# Patient Record
Sex: Female | Born: 1945
Health system: Southern US, Community
[De-identification: ages and names within clinical notes are randomized; demographics above are authoritative.]

## PROBLEM LIST (undated history)

## (undated) HISTORY — PX: BREAST EXCISIONAL BIOPSY: SUR124

---

## 1998-08-29 ENCOUNTER — Encounter (INDEPENDENT_AMBULATORY_CARE_PROVIDER_SITE_OTHER): Payer: Self-pay

## 1998-08-29 ENCOUNTER — Other Ambulatory Visit: Admission: RE | Admit: 1998-08-29 | Discharge: 1998-08-29 | Payer: Self-pay | Admitting: Obstetrics and Gynecology

## 1998-12-07 ENCOUNTER — Encounter: Admission: RE | Admit: 1998-12-07 | Discharge: 1998-12-07 | Payer: Self-pay | Admitting: Obstetrics and Gynecology

## 1998-12-07 ENCOUNTER — Encounter: Payer: Self-pay | Admitting: Obstetrics and Gynecology

## 1999-12-10 ENCOUNTER — Encounter: Admission: RE | Admit: 1999-12-10 | Discharge: 1999-12-10 | Payer: Self-pay | Admitting: Obstetrics and Gynecology

## 1999-12-10 ENCOUNTER — Encounter: Payer: Self-pay | Admitting: Obstetrics and Gynecology

## 2000-12-10 ENCOUNTER — Encounter: Payer: Self-pay | Admitting: Internal Medicine

## 2000-12-10 ENCOUNTER — Encounter: Admission: RE | Admit: 2000-12-10 | Discharge: 2000-12-10 | Payer: Self-pay | Admitting: Internal Medicine

## 2001-12-10 ENCOUNTER — Encounter: Payer: Self-pay | Admitting: Internal Medicine

## 2001-12-10 ENCOUNTER — Encounter: Admission: RE | Admit: 2001-12-10 | Discharge: 2001-12-10 | Payer: Self-pay | Admitting: Internal Medicine

## 2002-12-15 ENCOUNTER — Encounter: Admission: RE | Admit: 2002-12-15 | Discharge: 2002-12-15 | Payer: Self-pay | Admitting: Internal Medicine

## 2003-12-15 ENCOUNTER — Encounter: Admission: RE | Admit: 2003-12-15 | Discharge: 2003-12-15 | Payer: Self-pay | Admitting: Internal Medicine

## 2004-12-17 ENCOUNTER — Encounter: Admission: RE | Admit: 2004-12-17 | Discharge: 2004-12-17 | Payer: Self-pay | Admitting: Internal Medicine

## 2005-12-19 ENCOUNTER — Encounter: Admission: RE | Admit: 2005-12-19 | Discharge: 2005-12-19 | Payer: Self-pay | Admitting: Internal Medicine

## 2006-12-22 ENCOUNTER — Encounter: Admission: RE | Admit: 2006-12-22 | Discharge: 2006-12-22 | Payer: Self-pay | Admitting: Internal Medicine

## 2007-09-28 ENCOUNTER — Other Ambulatory Visit: Admission: RE | Admit: 2007-09-28 | Discharge: 2007-09-28 | Payer: Self-pay | Admitting: Obstetrics and Gynecology

## 2007-12-24 ENCOUNTER — Encounter: Admission: RE | Admit: 2007-12-24 | Discharge: 2007-12-24 | Payer: Self-pay | Admitting: Internal Medicine

## 2008-09-28 ENCOUNTER — Other Ambulatory Visit: Admission: RE | Admit: 2008-09-28 | Discharge: 2008-09-28 | Payer: Self-pay | Admitting: Obstetrics and Gynecology

## 2008-12-26 ENCOUNTER — Encounter: Admission: RE | Admit: 2008-12-26 | Discharge: 2008-12-26 | Payer: Self-pay | Admitting: Internal Medicine

## 2009-10-02 ENCOUNTER — Other Ambulatory Visit: Admission: RE | Admit: 2009-10-02 | Discharge: 2009-10-02 | Payer: Self-pay | Admitting: Obstetrics and Gynecology

## 2009-12-27 ENCOUNTER — Encounter: Admission: RE | Admit: 2009-12-27 | Discharge: 2009-12-27 | Payer: Self-pay | Admitting: Internal Medicine

## 2010-06-12 ENCOUNTER — Other Ambulatory Visit: Payer: Self-pay | Admitting: Dermatology

## 2010-10-04 ENCOUNTER — Other Ambulatory Visit: Payer: Self-pay | Admitting: Obstetrics and Gynecology

## 2010-10-04 ENCOUNTER — Other Ambulatory Visit (HOSPITAL_COMMUNITY)
Admission: RE | Admit: 2010-10-04 | Discharge: 2010-10-04 | Disposition: A | Discharge status: Home / Self Care | Payer: PRIVATE HEALTH INSURANCE | Source: Ambulatory Visit | Attending: Obstetrics and Gynecology | Admitting: Obstetrics and Gynecology

## 2010-10-04 DIAGNOSIS — Z01419 Encounter for gynecological examination (general) (routine) without abnormal findings: Secondary | ICD-10-CM | POA: Insufficient documentation

## 2010-11-06 ENCOUNTER — Other Ambulatory Visit: Payer: Self-pay | Admitting: Internal Medicine

## 2010-11-06 DIAGNOSIS — Z1231 Encounter for screening mammogram for malignant neoplasm of breast: Secondary | ICD-10-CM

## 2010-12-28 ENCOUNTER — Ambulatory Visit
Admission: RE | Admit: 2010-12-28 | Discharge: 2010-12-28 | Disposition: A | Discharge status: Home / Self Care | Payer: PRIVATE HEALTH INSURANCE | Source: Ambulatory Visit | Attending: Internal Medicine | Admitting: Internal Medicine

## 2010-12-28 DIAGNOSIS — Z1231 Encounter for screening mammogram for malignant neoplasm of breast: Secondary | ICD-10-CM

## 2011-06-05 ENCOUNTER — Other Ambulatory Visit: Payer: Self-pay | Admitting: Dermatology

## 2011-07-09 ENCOUNTER — Other Ambulatory Visit: Payer: Self-pay | Admitting: Obstetrics and Gynecology

## 2011-07-09 ENCOUNTER — Other Ambulatory Visit (HOSPITAL_COMMUNITY)
Admission: RE | Admit: 2011-07-09 | Discharge: 2011-07-09 | Disposition: A | Discharge status: Home / Self Care | Payer: PRIVATE HEALTH INSURANCE | Source: Ambulatory Visit | Attending: Obstetrics and Gynecology | Admitting: Obstetrics and Gynecology

## 2011-07-09 DIAGNOSIS — Z1159 Encounter for screening for other viral diseases: Secondary | ICD-10-CM | POA: Insufficient documentation

## 2011-07-09 DIAGNOSIS — Z01419 Encounter for gynecological examination (general) (routine) without abnormal findings: Secondary | ICD-10-CM | POA: Insufficient documentation

## 2011-11-01 ENCOUNTER — Other Ambulatory Visit: Payer: Self-pay | Admitting: Internal Medicine

## 2011-11-01 DIAGNOSIS — Z1231 Encounter for screening mammogram for malignant neoplasm of breast: Secondary | ICD-10-CM

## 2011-12-05 DIAGNOSIS — L821 Other seborrheic keratosis: Secondary | ICD-10-CM | POA: Diagnosis not present

## 2011-12-05 DIAGNOSIS — D239 Other benign neoplasm of skin, unspecified: Secondary | ICD-10-CM | POA: Diagnosis not present

## 2011-12-30 ENCOUNTER — Ambulatory Visit
Admission: RE | Admit: 2011-12-30 | Discharge: 2011-12-30 | Disposition: A | Discharge status: Home / Self Care | Payer: Medicare Other | Source: Ambulatory Visit | Attending: Internal Medicine | Admitting: Internal Medicine

## 2011-12-30 DIAGNOSIS — Z1231 Encounter for screening mammogram for malignant neoplasm of breast: Secondary | ICD-10-CM

## 2012-02-24 DIAGNOSIS — E559 Vitamin D deficiency, unspecified: Secondary | ICD-10-CM | POA: Diagnosis not present

## 2012-02-24 DIAGNOSIS — R7301 Impaired fasting glucose: Secondary | ICD-10-CM | POA: Diagnosis not present

## 2012-02-24 DIAGNOSIS — G35 Multiple sclerosis: Secondary | ICD-10-CM | POA: Diagnosis not present

## 2012-02-24 DIAGNOSIS — Z23 Encounter for immunization: Secondary | ICD-10-CM | POA: Diagnosis not present

## 2012-02-24 DIAGNOSIS — M899 Disorder of bone, unspecified: Secondary | ICD-10-CM | POA: Diagnosis not present

## 2012-02-24 DIAGNOSIS — Z0000 Encounter for general adult medical examination without abnormal findings: Secondary | ICD-10-CM | POA: Diagnosis not present

## 2012-02-24 DIAGNOSIS — M949 Disorder of cartilage, unspecified: Secondary | ICD-10-CM | POA: Diagnosis not present

## 2012-02-24 DIAGNOSIS — Z136 Encounter for screening for cardiovascular disorders: Secondary | ICD-10-CM | POA: Diagnosis not present

## 2012-03-05 DIAGNOSIS — Z79899 Other long term (current) drug therapy: Secondary | ICD-10-CM | POA: Diagnosis not present

## 2012-05-28 DIAGNOSIS — Z85828 Personal history of other malignant neoplasm of skin: Secondary | ICD-10-CM | POA: Diagnosis not present

## 2012-05-28 DIAGNOSIS — L820 Inflamed seborrheic keratosis: Secondary | ICD-10-CM | POA: Diagnosis not present

## 2012-05-28 DIAGNOSIS — D235 Other benign neoplasm of skin of trunk: Secondary | ICD-10-CM | POA: Diagnosis not present

## 2012-05-28 DIAGNOSIS — D2370 Other benign neoplasm of skin of unspecified lower limb, including hip: Secondary | ICD-10-CM | POA: Diagnosis not present

## 2012-05-28 DIAGNOSIS — D485 Neoplasm of uncertain behavior of skin: Secondary | ICD-10-CM | POA: Diagnosis not present

## 2012-05-28 DIAGNOSIS — L819 Disorder of pigmentation, unspecified: Secondary | ICD-10-CM | POA: Diagnosis not present

## 2012-05-28 DIAGNOSIS — L84 Corns and callosities: Secondary | ICD-10-CM | POA: Diagnosis not present

## 2012-05-28 DIAGNOSIS — C44711 Basal cell carcinoma of skin of unspecified lower limb, including hip: Secondary | ICD-10-CM | POA: Diagnosis not present

## 2012-05-28 DIAGNOSIS — L821 Other seborrheic keratosis: Secondary | ICD-10-CM | POA: Diagnosis not present

## 2012-07-10 ENCOUNTER — Other Ambulatory Visit (HOSPITAL_COMMUNITY)
Admission: RE | Admit: 2012-07-10 | Discharge: 2012-07-10 | Disposition: A | Discharge status: Home / Self Care | Payer: Medicare Other | Source: Ambulatory Visit | Attending: Obstetrics and Gynecology | Admitting: Obstetrics and Gynecology

## 2012-07-10 ENCOUNTER — Other Ambulatory Visit: Payer: Self-pay | Admitting: Obstetrics and Gynecology

## 2012-07-10 DIAGNOSIS — N9089 Other specified noninflammatory disorders of vulva and perineum: Secondary | ICD-10-CM | POA: Diagnosis not present

## 2012-07-10 DIAGNOSIS — Z124 Encounter for screening for malignant neoplasm of cervix: Secondary | ICD-10-CM | POA: Insufficient documentation

## 2012-07-10 DIAGNOSIS — Z01419 Encounter for gynecological examination (general) (routine) without abnormal findings: Secondary | ICD-10-CM | POA: Diagnosis not present

## 2012-10-23 ENCOUNTER — Other Ambulatory Visit: Payer: Self-pay

## 2012-10-23 DIAGNOSIS — Z1231 Encounter for screening mammogram for malignant neoplasm of breast: Secondary | ICD-10-CM

## 2012-11-26 DIAGNOSIS — D692 Other nonthrombocytopenic purpura: Secondary | ICD-10-CM | POA: Diagnosis not present

## 2012-11-26 DIAGNOSIS — Z85828 Personal history of other malignant neoplasm of skin: Secondary | ICD-10-CM | POA: Diagnosis not present

## 2012-11-26 DIAGNOSIS — L821 Other seborrheic keratosis: Secondary | ICD-10-CM | POA: Diagnosis not present

## 2012-11-26 DIAGNOSIS — D2370 Other benign neoplasm of skin of unspecified lower limb, including hip: Secondary | ICD-10-CM | POA: Diagnosis not present

## 2012-11-26 DIAGNOSIS — D239 Other benign neoplasm of skin, unspecified: Secondary | ICD-10-CM | POA: Diagnosis not present

## 2012-12-30 ENCOUNTER — Ambulatory Visit
Admission: RE | Admit: 2012-12-30 | Discharge: 2012-12-30 | Disposition: A | Discharge status: Home / Self Care | Payer: Medicare Other | Source: Ambulatory Visit

## 2012-12-30 DIAGNOSIS — Z1231 Encounter for screening mammogram for malignant neoplasm of breast: Secondary | ICD-10-CM

## 2013-03-02 DIAGNOSIS — R7301 Impaired fasting glucose: Secondary | ICD-10-CM | POA: Diagnosis not present

## 2013-03-02 DIAGNOSIS — Z131 Encounter for screening for diabetes mellitus: Secondary | ICD-10-CM | POA: Diagnosis not present

## 2013-03-02 DIAGNOSIS — Z23 Encounter for immunization: Secondary | ICD-10-CM | POA: Diagnosis not present

## 2013-03-02 DIAGNOSIS — Z0000 Encounter for general adult medical examination without abnormal findings: Secondary | ICD-10-CM | POA: Diagnosis not present

## 2013-03-02 DIAGNOSIS — G35 Multiple sclerosis: Secondary | ICD-10-CM | POA: Diagnosis not present

## 2013-03-02 DIAGNOSIS — M949 Disorder of cartilage, unspecified: Secondary | ICD-10-CM | POA: Diagnosis not present

## 2013-03-02 DIAGNOSIS — M899 Disorder of bone, unspecified: Secondary | ICD-10-CM | POA: Diagnosis not present

## 2013-03-02 DIAGNOSIS — Z86010 Personal history of colonic polyps: Secondary | ICD-10-CM | POA: Diagnosis not present

## 2013-03-02 DIAGNOSIS — E7800 Pure hypercholesterolemia, unspecified: Secondary | ICD-10-CM | POA: Diagnosis not present

## 2013-03-31 ENCOUNTER — Other Ambulatory Visit: Payer: Self-pay | Admitting: Gastroenterology

## 2013-03-31 DIAGNOSIS — K620 Anal polyp: Secondary | ICD-10-CM | POA: Diagnosis not present

## 2013-03-31 DIAGNOSIS — Z09 Encounter for follow-up examination after completed treatment for conditions other than malignant neoplasm: Secondary | ICD-10-CM | POA: Diagnosis not present

## 2013-03-31 DIAGNOSIS — D126 Benign neoplasm of colon, unspecified: Secondary | ICD-10-CM | POA: Diagnosis not present

## 2013-03-31 DIAGNOSIS — Z86010 Personal history of colonic polyps: Secondary | ICD-10-CM | POA: Diagnosis not present

## 2013-05-27 ENCOUNTER — Other Ambulatory Visit: Payer: Self-pay | Admitting: Dermatology

## 2013-05-27 DIAGNOSIS — Z85828 Personal history of other malignant neoplasm of skin: Secondary | ICD-10-CM | POA: Diagnosis not present

## 2013-05-27 DIAGNOSIS — C44319 Basal cell carcinoma of skin of other parts of face: Secondary | ICD-10-CM | POA: Diagnosis not present

## 2013-05-27 DIAGNOSIS — L821 Other seborrheic keratosis: Secondary | ICD-10-CM | POA: Diagnosis not present

## 2013-05-27 DIAGNOSIS — L723 Sebaceous cyst: Secondary | ICD-10-CM | POA: Diagnosis not present

## 2013-05-27 DIAGNOSIS — D2370 Other benign neoplasm of skin of unspecified lower limb, including hip: Secondary | ICD-10-CM | POA: Diagnosis not present

## 2013-05-27 DIAGNOSIS — C4441 Basal cell carcinoma of skin of scalp and neck: Secondary | ICD-10-CM | POA: Diagnosis not present

## 2013-05-27 DIAGNOSIS — D239 Other benign neoplasm of skin, unspecified: Secondary | ICD-10-CM | POA: Diagnosis not present

## 2013-05-27 DIAGNOSIS — D485 Neoplasm of uncertain behavior of skin: Secondary | ICD-10-CM | POA: Diagnosis not present

## 2013-06-16 ENCOUNTER — Other Ambulatory Visit: Payer: Self-pay | Admitting: Dermatology

## 2013-06-16 DIAGNOSIS — Z85828 Personal history of other malignant neoplasm of skin: Secondary | ICD-10-CM | POA: Diagnosis not present

## 2013-06-16 DIAGNOSIS — C4401 Basal cell carcinoma of skin of lip: Secondary | ICD-10-CM | POA: Diagnosis not present

## 2013-06-16 DIAGNOSIS — C44319 Basal cell carcinoma of skin of other parts of face: Secondary | ICD-10-CM | POA: Diagnosis not present

## 2013-06-16 DIAGNOSIS — C44611 Basal cell carcinoma of skin of unspecified upper limb, including shoulder: Secondary | ICD-10-CM | POA: Diagnosis not present

## 2013-06-16 DIAGNOSIS — C44211 Basal cell carcinoma of skin of unspecified ear and external auricular canal: Secondary | ICD-10-CM | POA: Diagnosis not present

## 2013-06-16 DIAGNOSIS — C44519 Basal cell carcinoma of skin of other part of trunk: Secondary | ICD-10-CM | POA: Diagnosis not present

## 2013-06-16 DIAGNOSIS — C4441 Basal cell carcinoma of skin of scalp and neck: Secondary | ICD-10-CM | POA: Diagnosis not present

## 2013-06-16 DIAGNOSIS — C44111 Basal cell carcinoma of skin of unspecified eyelid, including canthus: Secondary | ICD-10-CM | POA: Diagnosis not present

## 2013-07-12 DIAGNOSIS — Z01419 Encounter for gynecological examination (general) (routine) without abnormal findings: Secondary | ICD-10-CM | POA: Diagnosis not present

## 2013-10-28 ENCOUNTER — Other Ambulatory Visit: Payer: Self-pay

## 2013-10-28 DIAGNOSIS — Z1231 Encounter for screening mammogram for malignant neoplasm of breast: Secondary | ICD-10-CM

## 2013-11-29 DIAGNOSIS — B882 Other arthropod infestations: Secondary | ICD-10-CM | POA: Diagnosis not present

## 2013-11-29 DIAGNOSIS — L720 Epidermal cyst: Secondary | ICD-10-CM | POA: Diagnosis not present

## 2013-11-29 DIAGNOSIS — Z85828 Personal history of other malignant neoplasm of skin: Secondary | ICD-10-CM | POA: Diagnosis not present

## 2013-11-29 DIAGNOSIS — L718 Other rosacea: Secondary | ICD-10-CM | POA: Diagnosis not present

## 2013-11-29 DIAGNOSIS — L821 Other seborrheic keratosis: Secondary | ICD-10-CM | POA: Diagnosis not present

## 2013-11-29 DIAGNOSIS — L919 Hypertrophic disorder of the skin, unspecified: Secondary | ICD-10-CM | POA: Diagnosis not present

## 2013-11-29 DIAGNOSIS — D485 Neoplasm of uncertain behavior of skin: Secondary | ICD-10-CM | POA: Diagnosis not present

## 2013-11-29 DIAGNOSIS — D225 Melanocytic nevi of trunk: Secondary | ICD-10-CM | POA: Diagnosis not present

## 2013-12-31 ENCOUNTER — Ambulatory Visit
Admission: RE | Admit: 2013-12-31 | Discharge: 2013-12-31 | Disposition: A | Discharge status: Home / Self Care | Payer: Medicare Other | Source: Ambulatory Visit

## 2013-12-31 DIAGNOSIS — Z1231 Encounter for screening mammogram for malignant neoplasm of breast: Secondary | ICD-10-CM | POA: Diagnosis not present

## 2014-01-04 ENCOUNTER — Other Ambulatory Visit: Payer: Self-pay | Admitting: Internal Medicine

## 2014-01-04 DIAGNOSIS — R928 Other abnormal and inconclusive findings on diagnostic imaging of breast: Secondary | ICD-10-CM

## 2014-01-12 ENCOUNTER — Ambulatory Visit
Admission: RE | Admit: 2014-01-12 | Discharge: 2014-01-12 | Disposition: A | Discharge status: Home / Self Care | Payer: Medicare Other | Source: Ambulatory Visit | Attending: Internal Medicine | Admitting: Internal Medicine

## 2014-01-12 DIAGNOSIS — N6002 Solitary cyst of left breast: Secondary | ICD-10-CM | POA: Diagnosis not present

## 2014-01-12 DIAGNOSIS — R928 Other abnormal and inconclusive findings on diagnostic imaging of breast: Secondary | ICD-10-CM

## 2014-01-13 ENCOUNTER — Other Ambulatory Visit: Payer: Medicare Other

## 2014-03-07 DIAGNOSIS — R7301 Impaired fasting glucose: Secondary | ICD-10-CM | POA: Diagnosis not present

## 2014-03-07 DIAGNOSIS — Z1389 Encounter for screening for other disorder: Secondary | ICD-10-CM | POA: Diagnosis not present

## 2014-03-07 DIAGNOSIS — E780 Pure hypercholesterolemia: Secondary | ICD-10-CM | POA: Diagnosis not present

## 2014-03-07 DIAGNOSIS — M8580 Other specified disorders of bone density and structure, unspecified site: Secondary | ICD-10-CM | POA: Diagnosis not present

## 2014-03-07 DIAGNOSIS — Z0000 Encounter for general adult medical examination without abnormal findings: Secondary | ICD-10-CM | POA: Diagnosis not present

## 2014-06-06 DIAGNOSIS — Z85828 Personal history of other malignant neoplasm of skin: Secondary | ICD-10-CM | POA: Diagnosis not present

## 2014-06-06 DIAGNOSIS — L723 Sebaceous cyst: Secondary | ICD-10-CM | POA: Diagnosis not present

## 2014-06-06 DIAGNOSIS — L84 Corns and callosities: Secondary | ICD-10-CM | POA: Diagnosis not present

## 2014-06-06 DIAGNOSIS — D1801 Hemangioma of skin and subcutaneous tissue: Secondary | ICD-10-CM | POA: Diagnosis not present

## 2014-06-06 DIAGNOSIS — D225 Melanocytic nevi of trunk: Secondary | ICD-10-CM | POA: Diagnosis not present

## 2014-06-06 DIAGNOSIS — L821 Other seborrheic keratosis: Secondary | ICD-10-CM | POA: Diagnosis not present

## 2014-07-18 ENCOUNTER — Other Ambulatory Visit (HOSPITAL_COMMUNITY)
Admission: RE | Admit: 2014-07-18 | Discharge: 2014-07-18 | Disposition: A | Discharge status: Home / Self Care | Payer: Medicare Other | Source: Ambulatory Visit | Attending: Obstetrics and Gynecology | Admitting: Obstetrics and Gynecology

## 2014-07-18 ENCOUNTER — Other Ambulatory Visit: Payer: Self-pay | Admitting: Obstetrics and Gynecology

## 2014-07-18 DIAGNOSIS — Z1151 Encounter for screening for human papillomavirus (HPV): Secondary | ICD-10-CM | POA: Insufficient documentation

## 2014-07-18 DIAGNOSIS — Z01411 Encounter for gynecological examination (general) (routine) with abnormal findings: Secondary | ICD-10-CM | POA: Diagnosis not present

## 2014-07-18 DIAGNOSIS — Z124 Encounter for screening for malignant neoplasm of cervix: Secondary | ICD-10-CM | POA: Insufficient documentation

## 2014-07-18 DIAGNOSIS — N952 Postmenopausal atrophic vaginitis: Secondary | ICD-10-CM | POA: Diagnosis not present

## 2014-07-18 DIAGNOSIS — N750 Cyst of Bartholin's gland: Secondary | ICD-10-CM | POA: Diagnosis not present

## 2014-07-19 LAB — CYTOLOGY - PAP

## 2014-11-24 ENCOUNTER — Other Ambulatory Visit: Payer: Self-pay

## 2014-11-24 DIAGNOSIS — Z1231 Encounter for screening mammogram for malignant neoplasm of breast: Secondary | ICD-10-CM

## 2014-12-06 DIAGNOSIS — D485 Neoplasm of uncertain behavior of skin: Secondary | ICD-10-CM | POA: Diagnosis not present

## 2014-12-06 DIAGNOSIS — L814 Other melanin hyperpigmentation: Secondary | ICD-10-CM | POA: Diagnosis not present

## 2014-12-06 DIAGNOSIS — D2261 Melanocytic nevi of right upper limb, including shoulder: Secondary | ICD-10-CM | POA: Diagnosis not present

## 2014-12-06 DIAGNOSIS — L7211 Pilar cyst: Secondary | ICD-10-CM | POA: Diagnosis not present

## 2014-12-06 DIAGNOSIS — L929 Granulomatous disorder of the skin and subcutaneous tissue, unspecified: Secondary | ICD-10-CM | POA: Diagnosis not present

## 2014-12-06 DIAGNOSIS — Z85828 Personal history of other malignant neoplasm of skin: Secondary | ICD-10-CM | POA: Diagnosis not present

## 2014-12-06 DIAGNOSIS — D2262 Melanocytic nevi of left upper limb, including shoulder: Secondary | ICD-10-CM | POA: Diagnosis not present

## 2014-12-06 DIAGNOSIS — L821 Other seborrheic keratosis: Secondary | ICD-10-CM | POA: Diagnosis not present

## 2014-12-06 DIAGNOSIS — D2271 Melanocytic nevi of right lower limb, including hip: Secondary | ICD-10-CM | POA: Diagnosis not present

## 2014-12-06 DIAGNOSIS — D2372 Other benign neoplasm of skin of left lower limb, including hip: Secondary | ICD-10-CM | POA: Diagnosis not present

## 2014-12-06 DIAGNOSIS — L819 Disorder of pigmentation, unspecified: Secondary | ICD-10-CM | POA: Diagnosis not present

## 2014-12-06 DIAGNOSIS — D225 Melanocytic nevi of trunk: Secondary | ICD-10-CM | POA: Diagnosis not present

## 2014-12-06 DIAGNOSIS — D2272 Melanocytic nevi of left lower limb, including hip: Secondary | ICD-10-CM | POA: Diagnosis not present

## 2015-01-02 ENCOUNTER — Ambulatory Visit
Admission: RE | Admit: 2015-01-02 | Discharge: 2015-01-02 | Disposition: A | Discharge status: Home / Self Care | Payer: Medicare Other | Source: Ambulatory Visit

## 2015-01-02 DIAGNOSIS — Z1231 Encounter for screening mammogram for malignant neoplasm of breast: Secondary | ICD-10-CM | POA: Diagnosis not present

## 2015-03-24 DIAGNOSIS — M8588 Other specified disorders of bone density and structure, other site: Secondary | ICD-10-CM | POA: Diagnosis not present

## 2015-03-24 DIAGNOSIS — Z0000 Encounter for general adult medical examination without abnormal findings: Secondary | ICD-10-CM | POA: Diagnosis not present

## 2015-03-24 DIAGNOSIS — E7800 Pure hypercholesterolemia, unspecified: Secondary | ICD-10-CM | POA: Diagnosis not present

## 2015-03-24 DIAGNOSIS — R7301 Impaired fasting glucose: Secondary | ICD-10-CM | POA: Diagnosis not present

## 2015-03-24 DIAGNOSIS — Z1389 Encounter for screening for other disorder: Secondary | ICD-10-CM | POA: Diagnosis not present

## 2015-04-26 DIAGNOSIS — L720 Epidermal cyst: Secondary | ICD-10-CM | POA: Diagnosis not present

## 2015-04-26 DIAGNOSIS — Z85828 Personal history of other malignant neoplasm of skin: Secondary | ICD-10-CM | POA: Diagnosis not present

## 2015-05-23 DIAGNOSIS — L718 Other rosacea: Secondary | ICD-10-CM | POA: Diagnosis not present

## 2015-05-23 DIAGNOSIS — D225 Melanocytic nevi of trunk: Secondary | ICD-10-CM | POA: Diagnosis not present

## 2015-05-23 DIAGNOSIS — Z85828 Personal history of other malignant neoplasm of skin: Secondary | ICD-10-CM | POA: Diagnosis not present

## 2015-05-23 DIAGNOSIS — D2372 Other benign neoplasm of skin of left lower limb, including hip: Secondary | ICD-10-CM | POA: Diagnosis not present

## 2015-05-23 DIAGNOSIS — D2262 Melanocytic nevi of left upper limb, including shoulder: Secondary | ICD-10-CM | POA: Diagnosis not present

## 2015-05-23 DIAGNOSIS — I788 Other diseases of capillaries: Secondary | ICD-10-CM | POA: Diagnosis not present

## 2015-05-23 DIAGNOSIS — L821 Other seborrheic keratosis: Secondary | ICD-10-CM | POA: Diagnosis not present

## 2015-05-23 DIAGNOSIS — D2261 Melanocytic nevi of right upper limb, including shoulder: Secondary | ICD-10-CM | POA: Diagnosis not present

## 2015-11-15 ENCOUNTER — Other Ambulatory Visit: Payer: Self-pay

## 2015-11-15 DIAGNOSIS — Z1231 Encounter for screening mammogram for malignant neoplasm of breast: Secondary | ICD-10-CM

## 2015-11-22 DIAGNOSIS — L570 Actinic keratosis: Secondary | ICD-10-CM | POA: Diagnosis not present

## 2015-11-22 DIAGNOSIS — L853 Xerosis cutis: Secondary | ICD-10-CM | POA: Diagnosis not present

## 2015-11-22 DIAGNOSIS — D225 Melanocytic nevi of trunk: Secondary | ICD-10-CM | POA: Diagnosis not present

## 2015-11-22 DIAGNOSIS — L718 Other rosacea: Secondary | ICD-10-CM | POA: Diagnosis not present

## 2015-11-22 DIAGNOSIS — Z85828 Personal history of other malignant neoplasm of skin: Secondary | ICD-10-CM | POA: Diagnosis not present

## 2015-11-22 DIAGNOSIS — L821 Other seborrheic keratosis: Secondary | ICD-10-CM | POA: Diagnosis not present

## 2015-12-08 DIAGNOSIS — J069 Acute upper respiratory infection, unspecified: Secondary | ICD-10-CM | POA: Diagnosis not present

## 2016-01-03 ENCOUNTER — Ambulatory Visit
Admission: RE | Admit: 2016-01-03 | Discharge: 2016-01-03 | Disposition: A | Discharge status: Home / Self Care | Payer: Medicare Other | Source: Ambulatory Visit

## 2016-01-03 DIAGNOSIS — Z1231 Encounter for screening mammogram for malignant neoplasm of breast: Secondary | ICD-10-CM | POA: Diagnosis not present

## 2016-03-25 DIAGNOSIS — R7301 Impaired fasting glucose: Secondary | ICD-10-CM | POA: Diagnosis not present

## 2016-03-25 DIAGNOSIS — Z1239 Encounter for other screening for malignant neoplasm of breast: Secondary | ICD-10-CM | POA: Diagnosis not present

## 2016-03-25 DIAGNOSIS — Z23 Encounter for immunization: Secondary | ICD-10-CM | POA: Diagnosis not present

## 2016-03-25 DIAGNOSIS — Z1211 Encounter for screening for malignant neoplasm of colon: Secondary | ICD-10-CM | POA: Diagnosis not present

## 2016-03-25 DIAGNOSIS — D126 Benign neoplasm of colon, unspecified: Secondary | ICD-10-CM | POA: Diagnosis not present

## 2016-03-25 DIAGNOSIS — G35 Multiple sclerosis: Secondary | ICD-10-CM | POA: Diagnosis not present

## 2016-03-25 DIAGNOSIS — E785 Hyperlipidemia, unspecified: Secondary | ICD-10-CM | POA: Diagnosis not present

## 2016-03-25 DIAGNOSIS — M8580 Other specified disorders of bone density and structure, unspecified site: Secondary | ICD-10-CM | POA: Diagnosis not present

## 2016-05-21 DIAGNOSIS — G35 Multiple sclerosis: Secondary | ICD-10-CM | POA: Diagnosis not present

## 2016-05-21 DIAGNOSIS — E785 Hyperlipidemia, unspecified: Secondary | ICD-10-CM | POA: Diagnosis not present

## 2016-05-21 DIAGNOSIS — R7301 Impaired fasting glucose: Secondary | ICD-10-CM | POA: Diagnosis not present

## 2016-05-21 DIAGNOSIS — M85859 Other specified disorders of bone density and structure, unspecified thigh: Secondary | ICD-10-CM | POA: Diagnosis not present

## 2016-05-22 DIAGNOSIS — D2271 Melanocytic nevi of right lower limb, including hip: Secondary | ICD-10-CM | POA: Diagnosis not present

## 2016-05-22 DIAGNOSIS — D485 Neoplasm of uncertain behavior of skin: Secondary | ICD-10-CM | POA: Diagnosis not present

## 2016-05-22 DIAGNOSIS — D2272 Melanocytic nevi of left lower limb, including hip: Secondary | ICD-10-CM | POA: Diagnosis not present

## 2016-05-22 DIAGNOSIS — L720 Epidermal cyst: Secondary | ICD-10-CM | POA: Diagnosis not present

## 2016-05-22 DIAGNOSIS — D2261 Melanocytic nevi of right upper limb, including shoulder: Secondary | ICD-10-CM | POA: Diagnosis not present

## 2016-05-22 DIAGNOSIS — D2262 Melanocytic nevi of left upper limb, including shoulder: Secondary | ICD-10-CM | POA: Diagnosis not present

## 2016-05-22 DIAGNOSIS — D2372 Other benign neoplasm of skin of left lower limb, including hip: Secondary | ICD-10-CM | POA: Diagnosis not present

## 2016-05-22 DIAGNOSIS — Z85828 Personal history of other malignant neoplasm of skin: Secondary | ICD-10-CM | POA: Diagnosis not present

## 2016-05-22 DIAGNOSIS — L821 Other seborrheic keratosis: Secondary | ICD-10-CM | POA: Diagnosis not present

## 2016-05-22 DIAGNOSIS — D225 Melanocytic nevi of trunk: Secondary | ICD-10-CM | POA: Diagnosis not present

## 2016-06-13 DIAGNOSIS — D225 Melanocytic nevi of trunk: Secondary | ICD-10-CM | POA: Diagnosis not present

## 2016-06-13 DIAGNOSIS — Z85828 Personal history of other malignant neoplasm of skin: Secondary | ICD-10-CM | POA: Diagnosis not present

## 2016-06-13 DIAGNOSIS — D485 Neoplasm of uncertain behavior of skin: Secondary | ICD-10-CM | POA: Diagnosis not present

## 2016-07-15 ENCOUNTER — Other Ambulatory Visit: Payer: Self-pay | Admitting: Obstetrics and Gynecology

## 2016-07-15 ENCOUNTER — Other Ambulatory Visit (HOSPITAL_COMMUNITY)
Admission: RE | Admit: 2016-07-15 | Discharge: 2016-07-15 | Disposition: A | Discharge status: Home / Self Care | Payer: Medicare Other | Source: Ambulatory Visit | Attending: Obstetrics and Gynecology | Admitting: Obstetrics and Gynecology

## 2016-07-15 DIAGNOSIS — Z01419 Encounter for gynecological examination (general) (routine) without abnormal findings: Secondary | ICD-10-CM | POA: Diagnosis not present

## 2016-07-15 DIAGNOSIS — Z124 Encounter for screening for malignant neoplasm of cervix: Secondary | ICD-10-CM | POA: Insufficient documentation

## 2016-07-17 LAB — CYTOLOGY - PAP: DIAGNOSIS: NEGATIVE

## 2016-11-21 DIAGNOSIS — D2372 Other benign neoplasm of skin of left lower limb, including hip: Secondary | ICD-10-CM | POA: Diagnosis not present

## 2016-11-21 DIAGNOSIS — L821 Other seborrheic keratosis: Secondary | ICD-10-CM | POA: Diagnosis not present

## 2016-11-21 DIAGNOSIS — D485 Neoplasm of uncertain behavior of skin: Secondary | ICD-10-CM | POA: Diagnosis not present

## 2016-11-21 DIAGNOSIS — Z85828 Personal history of other malignant neoplasm of skin: Secondary | ICD-10-CM | POA: Diagnosis not present

## 2016-11-21 DIAGNOSIS — C44319 Basal cell carcinoma of skin of other parts of face: Secondary | ICD-10-CM | POA: Diagnosis not present

## 2016-11-21 DIAGNOSIS — D225 Melanocytic nevi of trunk: Secondary | ICD-10-CM | POA: Diagnosis not present

## 2016-11-22 ENCOUNTER — Other Ambulatory Visit: Payer: Self-pay | Admitting: Internal Medicine

## 2016-11-22 DIAGNOSIS — Z1231 Encounter for screening mammogram for malignant neoplasm of breast: Secondary | ICD-10-CM

## 2016-12-05 DIAGNOSIS — C4401 Basal cell carcinoma of skin of lip: Secondary | ICD-10-CM | POA: Diagnosis not present

## 2016-12-05 DIAGNOSIS — Z85828 Personal history of other malignant neoplasm of skin: Secondary | ICD-10-CM | POA: Diagnosis not present

## 2016-12-05 DIAGNOSIS — Z23 Encounter for immunization: Secondary | ICD-10-CM | POA: Diagnosis not present

## 2017-01-06 ENCOUNTER — Ambulatory Visit
Admission: RE | Admit: 2017-01-06 | Discharge: 2017-01-06 | Disposition: A | Discharge status: Home / Self Care | Payer: Medicare Other | Source: Ambulatory Visit | Attending: Internal Medicine | Admitting: Internal Medicine

## 2017-01-06 DIAGNOSIS — Z1231 Encounter for screening mammogram for malignant neoplasm of breast: Secondary | ICD-10-CM

## 2017-03-26 DIAGNOSIS — Z1389 Encounter for screening for other disorder: Secondary | ICD-10-CM | POA: Diagnosis not present

## 2017-03-26 DIAGNOSIS — R7301 Impaired fasting glucose: Secondary | ICD-10-CM | POA: Diagnosis not present

## 2017-03-26 DIAGNOSIS — G35 Multiple sclerosis: Secondary | ICD-10-CM | POA: Diagnosis not present

## 2017-03-26 DIAGNOSIS — Z1159 Encounter for screening for other viral diseases: Secondary | ICD-10-CM | POA: Diagnosis not present

## 2017-03-26 DIAGNOSIS — Z79899 Other long term (current) drug therapy: Secondary | ICD-10-CM | POA: Diagnosis not present

## 2017-03-26 DIAGNOSIS — E785 Hyperlipidemia, unspecified: Secondary | ICD-10-CM | POA: Diagnosis not present

## 2017-03-26 DIAGNOSIS — M8588 Other specified disorders of bone density and structure, other site: Secondary | ICD-10-CM | POA: Diagnosis not present

## 2017-03-26 DIAGNOSIS — Z0000 Encounter for general adult medical examination without abnormal findings: Secondary | ICD-10-CM | POA: Diagnosis not present

## 2017-05-15 DIAGNOSIS — M8588 Other specified disorders of bone density and structure, other site: Secondary | ICD-10-CM | POA: Diagnosis not present

## 2017-05-22 DIAGNOSIS — D2271 Melanocytic nevi of right lower limb, including hip: Secondary | ICD-10-CM | POA: Diagnosis not present

## 2017-05-22 DIAGNOSIS — D2262 Melanocytic nevi of left upper limb, including shoulder: Secondary | ICD-10-CM | POA: Diagnosis not present

## 2017-05-22 DIAGNOSIS — D2372 Other benign neoplasm of skin of left lower limb, including hip: Secondary | ICD-10-CM | POA: Diagnosis not present

## 2017-05-22 DIAGNOSIS — D225 Melanocytic nevi of trunk: Secondary | ICD-10-CM | POA: Diagnosis not present

## 2017-05-22 DIAGNOSIS — D2272 Melanocytic nevi of left lower limb, including hip: Secondary | ICD-10-CM | POA: Diagnosis not present

## 2017-05-22 DIAGNOSIS — L570 Actinic keratosis: Secondary | ICD-10-CM | POA: Diagnosis not present

## 2017-05-22 DIAGNOSIS — L821 Other seborrheic keratosis: Secondary | ICD-10-CM | POA: Diagnosis not present

## 2017-05-22 DIAGNOSIS — Z85828 Personal history of other malignant neoplasm of skin: Secondary | ICD-10-CM | POA: Diagnosis not present

## 2017-05-22 DIAGNOSIS — L820 Inflamed seborrheic keratosis: Secondary | ICD-10-CM | POA: Diagnosis not present

## 2017-05-22 DIAGNOSIS — D485 Neoplasm of uncertain behavior of skin: Secondary | ICD-10-CM | POA: Diagnosis not present

## 2017-05-22 DIAGNOSIS — D2261 Melanocytic nevi of right upper limb, including shoulder: Secondary | ICD-10-CM | POA: Diagnosis not present

## 2017-05-22 DIAGNOSIS — L430 Hypertrophic lichen planus: Secondary | ICD-10-CM | POA: Diagnosis not present

## 2017-10-23 DIAGNOSIS — Z23 Encounter for immunization: Secondary | ICD-10-CM | POA: Diagnosis not present

## 2017-11-05 ENCOUNTER — Other Ambulatory Visit: Payer: Self-pay | Admitting: Internal Medicine

## 2017-11-05 DIAGNOSIS — Z1231 Encounter for screening mammogram for malignant neoplasm of breast: Secondary | ICD-10-CM

## 2017-11-17 DIAGNOSIS — Z85828 Personal history of other malignant neoplasm of skin: Secondary | ICD-10-CM | POA: Diagnosis not present

## 2017-11-17 DIAGNOSIS — L821 Other seborrheic keratosis: Secondary | ICD-10-CM | POA: Diagnosis not present

## 2017-11-17 DIAGNOSIS — C44319 Basal cell carcinoma of skin of other parts of face: Secondary | ICD-10-CM | POA: Diagnosis not present

## 2017-11-17 DIAGNOSIS — D485 Neoplasm of uncertain behavior of skin: Secondary | ICD-10-CM | POA: Diagnosis not present

## 2017-12-18 DIAGNOSIS — C44319 Basal cell carcinoma of skin of other parts of face: Secondary | ICD-10-CM | POA: Diagnosis not present

## 2017-12-18 DIAGNOSIS — Z85828 Personal history of other malignant neoplasm of skin: Secondary | ICD-10-CM | POA: Diagnosis not present

## 2018-01-07 ENCOUNTER — Ambulatory Visit
Admission: RE | Admit: 2018-01-07 | Discharge: 2018-01-07 | Disposition: A | Discharge status: Home / Self Care | Payer: Medicare Other | Source: Ambulatory Visit | Attending: Internal Medicine | Admitting: Internal Medicine

## 2018-01-07 DIAGNOSIS — Z1231 Encounter for screening mammogram for malignant neoplasm of breast: Secondary | ICD-10-CM

## 2018-04-02 DIAGNOSIS — Z1389 Encounter for screening for other disorder: Secondary | ICD-10-CM | POA: Diagnosis not present

## 2018-04-02 DIAGNOSIS — M8580 Other specified disorders of bone density and structure, unspecified site: Secondary | ICD-10-CM | POA: Diagnosis not present

## 2018-04-02 DIAGNOSIS — Z0000 Encounter for general adult medical examination without abnormal findings: Secondary | ICD-10-CM | POA: Diagnosis not present

## 2018-04-02 DIAGNOSIS — R7301 Impaired fasting glucose: Secondary | ICD-10-CM | POA: Diagnosis not present

## 2018-04-02 DIAGNOSIS — Z1211 Encounter for screening for malignant neoplasm of colon: Secondary | ICD-10-CM | POA: Diagnosis not present

## 2018-04-02 DIAGNOSIS — E785 Hyperlipidemia, unspecified: Secondary | ICD-10-CM | POA: Diagnosis not present

## 2018-04-02 DIAGNOSIS — G35 Multiple sclerosis: Secondary | ICD-10-CM | POA: Diagnosis not present

## 2018-04-19 ENCOUNTER — Emergency Department (HOSPITAL_COMMUNITY): Payer: Medicare Other

## 2018-04-19 ENCOUNTER — Other Ambulatory Visit: Payer: Self-pay

## 2018-04-19 ENCOUNTER — Emergency Department (HOSPITAL_COMMUNITY)
Admission: EM | Admit: 2018-04-19 | Discharge: 2018-04-19 | Disposition: A | Discharge status: Home / Self Care | Payer: Medicare Other | Attending: Emergency Medicine | Admitting: Emergency Medicine

## 2018-04-19 ENCOUNTER — Encounter (HOSPITAL_COMMUNITY): Payer: Self-pay | Admitting: Emergency Medicine

## 2018-04-19 DIAGNOSIS — Z79899 Other long term (current) drug therapy: Secondary | ICD-10-CM | POA: Insufficient documentation

## 2018-04-19 DIAGNOSIS — Z7982 Long term (current) use of aspirin: Secondary | ICD-10-CM | POA: Diagnosis not present

## 2018-04-19 DIAGNOSIS — R739 Hyperglycemia, unspecified: Secondary | ICD-10-CM

## 2018-04-19 DIAGNOSIS — M7989 Other specified soft tissue disorders: Secondary | ICD-10-CM

## 2018-04-19 DIAGNOSIS — R233 Spontaneous ecchymoses: Secondary | ICD-10-CM | POA: Diagnosis not present

## 2018-04-19 DIAGNOSIS — R21 Rash and other nonspecific skin eruption: Secondary | ICD-10-CM

## 2018-04-19 DIAGNOSIS — I447 Left bundle-branch block, unspecified: Secondary | ICD-10-CM | POA: Diagnosis not present

## 2018-04-19 DIAGNOSIS — R0789 Other chest pain: Secondary | ICD-10-CM | POA: Diagnosis not present

## 2018-04-19 LAB — CBC WITH DIFFERENTIAL/PLATELET
Abs Immature Granulocytes: 0.03 10*3/uL (ref 0.00–0.07)
Basophils Absolute: 0 10*3/uL (ref 0.0–0.1)
Basophils Relative: 0 %
Eosinophils Absolute: 0.1 10*3/uL (ref 0.0–0.5)
Eosinophils Relative: 1 %
HEMATOCRIT: 39.2 % (ref 36.0–46.0)
Hemoglobin: 12.8 g/dL (ref 12.0–15.0)
Immature Granulocytes: 0 %
LYMPHS ABS: 0.9 10*3/uL (ref 0.7–4.0)
Lymphocytes Relative: 10 %
MCH: 30.5 pg (ref 26.0–34.0)
MCHC: 32.7 g/dL (ref 30.0–36.0)
MCV: 93.6 fL (ref 80.0–100.0)
Monocytes Absolute: 0.5 10*3/uL (ref 0.1–1.0)
Monocytes Relative: 6 %
Neutro Abs: 7.5 10*3/uL (ref 1.7–7.7)
Neutrophils Relative %: 83 %
Platelets: 262 10*3/uL (ref 150–400)
RBC: 4.19 MIL/uL (ref 3.87–5.11)
RDW: 12.2 % (ref 11.5–15.5)
WBC: 9 10*3/uL (ref 4.0–10.5)
nRBC: 0 % (ref 0.0–0.2)

## 2018-04-19 LAB — URINALYSIS, ROUTINE W REFLEX MICROSCOPIC
Bacteria, UA: NONE SEEN
Bilirubin Urine: NEGATIVE
Glucose, UA: NEGATIVE mg/dL
Ketones, ur: NEGATIVE mg/dL
Leukocytes,Ua: NEGATIVE
Nitrite: NEGATIVE
Protein, ur: NEGATIVE mg/dL
Specific Gravity, Urine: 1.008 (ref 1.005–1.030)
pH: 5 (ref 5.0–8.0)

## 2018-04-19 LAB — COMPREHENSIVE METABOLIC PANEL
ALT: 14 U/L (ref 0–44)
AST: 21 U/L (ref 15–41)
Albumin: 3.4 g/dL — ABNORMAL LOW (ref 3.5–5.0)
Alkaline Phosphatase: 70 U/L (ref 38–126)
Anion gap: 6 (ref 5–15)
BILIRUBIN TOTAL: 0.9 mg/dL (ref 0.3–1.2)
BUN: 11 mg/dL (ref 8–23)
CO2: 27 mmol/L (ref 22–32)
Calcium: 8.9 mg/dL (ref 8.9–10.3)
Chloride: 104 mmol/L (ref 98–111)
Creatinine, Ser: 0.69 mg/dL (ref 0.44–1.00)
GFR calc Af Amer: >60 mL/min (ref >60)
GFR calc non Af Amer: >60 mL/min (ref >60)
Glucose, Bld: 178 mg/dL — ABNORMAL HIGH (ref 70–99)
Potassium: 3.6 mmol/L (ref 3.5–5.1)
Sodium: 137 mmol/L (ref 135–145)
Total Protein: 7.1 g/dL (ref 6.5–8.1)

## 2018-04-19 LAB — TROPONIN I: Troponin I: <0.03 ng/mL (ref <0.03)

## 2018-04-19 LAB — LIPASE, BLOOD: Lipase: 29 U/L (ref 11–51)

## 2018-04-19 LAB — BRAIN NATRIURETIC PEPTIDE: B Natriuretic Peptide: 35.5 pg/mL (ref 0.0–100.0)

## 2018-04-19 LAB — PROTIME-INR
INR: 1.1 (ref 0.8–1.2)
Prothrombin Time: 14.1 seconds (ref 11.4–15.2)

## 2018-04-19 NOTE — ED Notes (Signed)
Lab will add on trop to blood already in lab

## 2018-04-19 NOTE — Discharge Instructions (Addendum)
Work-up for the leg swelling and rash without any acute findings which is very reassuring.  However the rash is very unusual and will require additional follow-up.  If you are able to get an appointment in with your dermatologist try to do that sooner than later.  Follow-up with your regular doctor to have your blood sugars rechecked and may need to have labs rechecked.  Return for any new or worse symptoms.  Chest x-ray was negative no evidence of any fluid on the lungs.  No evidence of any acute heart problem.

## 2018-04-19 NOTE — ED Triage Notes (Addendum)
Pt reports bilateral leg rash and swelling since Saturday.   Reports never having this issue before.  Reports tightness to calf and itching to front of legs.  Pain while ambulating

## 2018-04-19 NOTE — ED Notes (Signed)
Patient discharged with husband. Verbalized discharge instructions. Patient ambulating well out of facility.

## 2018-04-19 NOTE — ED Provider Notes (Signed)
[facility] Provider Note   CSN: [id number] Arrival date & time: 04/19/18  1740    History   Chief Complaint Chief Complaint  Patient presents with  . Rash  . Leg Swelling    HPI Audrey Nelson is a 73 y.o. female.     Patient with acute onset of bilateral leg swelling and a rash that is quite pronounced from the knee down some of its in the upper thigh area.  This started on Saturday.  There is a little bit of itching and a little bit of mild discomfort.  Patient did have boots on Saturday but her boots that she is worn many times before.  No chest pain no shortness of breath no abdominal pain no myalgias no joint swelling did not apply anything new to the legs.  No blood in her urine.  Recently being followed for borderline elevation in her blood sugar.  Does have a primary care doctor.  Never had anything like this before.  Patient also is followed by dermatology just to check her skin for any sun related lesions.  Patient does not feel ill or toxic at all.     History reviewed. No pertinent past medical history.  There are no active problems to display for this patient.   Past Surgical History:  Procedure Laterality Date  . BREAST EXCISIONAL BIOPSY Left      OB History   No obstetric history on file.      Home Medications    Prior to Admission medications   Medication Sig Start Date End Date Taking? Authorizing Provider  aspirin 81 MG tablet Take 81 mg by mouth once.   Yes [provider]  CALCIUM PO Take 1 tablet by mouth daily.   Yes [provider]  Multiple Vitamin (MULTIVITAMIN) tablet Take 1 tablet by mouth daily.   Yes [provider]    Family History No family history on file.  Social History Social History   Tobacco Use  . Smoking status: Never Smoker  . Smokeless tobacco: Never Used  Substance Use Topics  . Alcohol use: Not on file  . Drug use: Not on file     Allergies     Patient has no known allergies.   Review of Systems Review of Systems  Constitutional: Negative for chills and fever.  HENT: Negative for rhinorrhea and sore throat.   Eyes: Negative for visual disturbance.  Respiratory: Negative for cough and shortness of breath.   Cardiovascular: Positive for leg swelling. Negative for chest pain.  Gastrointestinal: Negative for abdominal pain, diarrhea, nausea and vomiting.  Genitourinary: Negative for dysuria.  Musculoskeletal: Negative for back pain and neck pain.  Skin: Positive for rash.  Neurological: Negative for dizziness, light-headedness and headaches.  Hematological: Does not bruise/bleed easily.  Psychiatric/Behavioral: Negative for confusion.     Physical Exam Updated Vital Signs BP 140/65   Pulse 97   Temp 98.3 F (36.8 C) (Oral)   Resp (!) 22   Ht 1.676 m (5\' 6" )   Wt 59 kg   SpO2 100%   BMI 20.98 kg/m   Physical Exam Vitals signs and nursing note reviewed.  Constitutional:      General: She is not in acute distress.    Appearance: She is well-developed. She is not ill-appearing or toxic-appearing.  HENT:     Head: Normocephalic and atraumatic.     Mouth/Throat:     Mouth: Mucous membranes are moist.  Eyes:  Extraocular Movements: Extraocular movements intact.     Conjunctiva/sclera: Conjunctivae normal.     Pupils: Pupils are equal, round, and reactive to light.  Neck:     Musculoskeletal: Normal range of motion and neck supple.  Cardiovascular:     Rate and Rhythm: Normal rate and regular rhythm.     Pulses: Normal pulses.     Heart sounds: Normal heart sounds. No murmur.  Pulmonary:     Effort: Pulmonary effort is normal. No respiratory distress.     Breath sounds: Normal breath sounds.  Abdominal:     General: Bowel sounds are normal.     Palpations: Abdomen is soft.     Tenderness: There is no abdominal tenderness.  Musculoskeletal:        General: Swelling present.     Comments: Lower  extremities with bilateral edema.  There is a petechial rash from the thighs down but much more pronounced and coalesced in the bilateral lower extremities.  Has good cap refill to the toes.  Dorsalis pedis pulses about 1+.  No open wounds no vesicles.  The rash does not blanch.  No hives.  Mild discomfort to both calves.  But no significant tenderness.  Skin:    General: Skin is warm and dry.     Capillary Refill: Capillary refill takes less than 2 seconds.     Findings: Rash present.     Comments: Rash is isolated to the thighs and lower extremity none on the upper extremities or trunk or face or neck.  Neurological:     General: No focal deficit present.     Mental Status: She is alert and oriented to person, place, and time.     Cranial Nerves: No cranial nerve deficit.     Sensory: No sensory deficit.     Motor: No weakness.      ED Treatments / Results  Labs (all labs ordered are listed, but only abnormal results are displayed) Labs Reviewed  COMPREHENSIVE METABOLIC PANEL - Abnormal; Notable for the following components:      Result Value   Glucose, Bld 178 (*)    Albumin 3.4 (*)    All other components within normal limits  URINALYSIS, ROUTINE W REFLEX MICROSCOPIC - Abnormal; Notable for the following components:   Hgb urine dipstick SMALL (*)    All other components within normal limits  CBC WITH DIFFERENTIAL/PLATELET  BRAIN NATRIURETIC PEPTIDE  LIPASE, BLOOD  PROTIME-INR  TROPONIN I    EKG EKG Interpretation  Date/Time:  Sunday April 19 2018 18:37:43 EDT Ventricular Rate:  92 PR Interval:    QRS Duration: 123 QT Interval:  373 QTC Calculation: 462 R Axis:   90 Text Interpretation:  Sinus rhythm Borderline short PR interval Left bundle branch block No previous ECGs available Confirmed by Fredia Sorrow [phone]) on 04/19/2018 7:03:46 PM   Radiology Dg Chest 2 View  Result Date: 04/19/2018 CLINICAL DATA:  Bilateral leg rash and swelling since Saturday. Tightness  to the calves and itching in front of the legs. EXAM: CHEST - 2 VIEW COMPARISON:  None. FINDINGS: Hyperinflation. Normal heart size and pulmonary vascularity. No focal airspace disease or consolidation in the lungs. No blunting of costophrenic angles. No pneumothorax. Mediastinal contours appear intact. IMPRESSION: No active cardiopulmonary disease. Electronically Signed   By: Lucienne Capers M.D.   On: 04/19/2018 20:23    Procedures Procedures (including critical care time)  Medications Ordered in ED Medications - No data to display   Initial Impression /  Assessment and Plan / ED Course  I have reviewed the triage vital signs and the nursing notes.  Pertinent labs & imaging results that were available during my care of the patient were reviewed by me and considered in my medical decision making (see chart for details).       Patient nontoxic no acute distress.  However the rash is petechial in nature certainly has bleeding under the skin was initially concerned about significant platelet abnormality or other underlying illness.  But she has no systemic symptoms at all.  Exact cause of this is not clear.  Lab work-up without any significant abnormalities at all other than the mildly elevated blood sugar at 178 this is been known that her blood sugars have been a little high and she is being followed by her primary care doctor for this.  This is a new finding.  Platelets were normal hemoglobin normal electrolytes normal renal function normal chest x-ray negative EKG without any significant findings other than left bundle branch block.  Patient not on any new medications or anything new that is been started.  Patient is not on Coumadin or any other significant blood thinner.  BNP was normal troponin was normal.  Reassuring that everything was negative but still very significant findings in lower extremity since patient nontoxic no acute distress will have her follow-up with her primary care doctor.   She also does have dermatology she will call them on Monday to see if they can see her in the next week or so.  She will return for any new or worse symptoms.  Findings do warrant additional work-up.  But patient safe for discharge home at this time.   Final Clinical Impressions(s) / ED Diagnoses   Final diagnoses:  Leg swelling  Rash  Petechial eruption  Hyperglycemia    ED Discharge Orders    None       Fredia Sorrow, MD 04/19/18 2217

## 2018-04-24 DIAGNOSIS — M1990 Unspecified osteoarthritis, unspecified site: Secondary | ICD-10-CM | POA: Diagnosis not present

## 2018-04-24 DIAGNOSIS — R21 Rash and other nonspecific skin eruption: Secondary | ICD-10-CM | POA: Diagnosis not present

## 2018-04-24 DIAGNOSIS — R600 Localized edema: Secondary | ICD-10-CM | POA: Diagnosis not present

## 2018-04-24 DIAGNOSIS — L209 Atopic dermatitis, unspecified: Secondary | ICD-10-CM | POA: Diagnosis not present

## 2018-04-27 DIAGNOSIS — M310 Hypersensitivity angiitis: Secondary | ICD-10-CM | POA: Diagnosis not present

## 2018-04-27 DIAGNOSIS — Z85828 Personal history of other malignant neoplasm of skin: Secondary | ICD-10-CM | POA: Diagnosis not present

## 2018-05-18 DIAGNOSIS — M310 Hypersensitivity angiitis: Secondary | ICD-10-CM | POA: Diagnosis not present

## 2018-05-18 DIAGNOSIS — Z79899 Other long term (current) drug therapy: Secondary | ICD-10-CM | POA: Diagnosis not present

## 2018-05-18 DIAGNOSIS — R21 Rash and other nonspecific skin eruption: Secondary | ICD-10-CM | POA: Diagnosis not present

## 2018-05-18 DIAGNOSIS — L309 Dermatitis, unspecified: Secondary | ICD-10-CM | POA: Diagnosis not present

## 2018-05-18 DIAGNOSIS — Z85828 Personal history of other malignant neoplasm of skin: Secondary | ICD-10-CM | POA: Diagnosis not present

## 2018-05-19 DIAGNOSIS — R21 Rash and other nonspecific skin eruption: Secondary | ICD-10-CM | POA: Diagnosis not present

## 2018-06-16 DIAGNOSIS — R7303 Prediabetes: Secondary | ICD-10-CM | POA: Diagnosis not present

## 2018-06-16 DIAGNOSIS — R3129 Other microscopic hematuria: Secondary | ICD-10-CM | POA: Diagnosis not present

## 2018-06-16 DIAGNOSIS — D649 Anemia, unspecified: Secondary | ICD-10-CM | POA: Diagnosis not present

## 2018-06-16 DIAGNOSIS — M310 Hypersensitivity angiitis: Secondary | ICD-10-CM | POA: Diagnosis not present

## 2018-06-19 ENCOUNTER — Other Ambulatory Visit: Payer: Self-pay | Admitting: Nephrology

## 2018-06-19 DIAGNOSIS — R3129 Other microscopic hematuria: Secondary | ICD-10-CM

## 2018-06-25 DIAGNOSIS — R7303 Prediabetes: Secondary | ICD-10-CM | POA: Diagnosis not present

## 2018-06-25 DIAGNOSIS — R3129 Other microscopic hematuria: Secondary | ICD-10-CM | POA: Diagnosis not present

## 2018-06-25 DIAGNOSIS — R030 Elevated blood-pressure reading, without diagnosis of hypertension: Secondary | ICD-10-CM | POA: Diagnosis not present

## 2018-06-25 DIAGNOSIS — D649 Anemia, unspecified: Secondary | ICD-10-CM | POA: Diagnosis not present

## 2018-06-25 DIAGNOSIS — M310 Hypersensitivity angiitis: Secondary | ICD-10-CM | POA: Diagnosis not present

## 2018-06-30 ENCOUNTER — Ambulatory Visit
Admission: RE | Admit: 2018-06-30 | Discharge: 2018-06-30 | Disposition: A | Discharge status: Home / Self Care | Payer: Medicare Other | Source: Ambulatory Visit | Attending: Nephrology | Admitting: Nephrology

## 2018-06-30 DIAGNOSIS — R319 Hematuria, unspecified: Secondary | ICD-10-CM | POA: Diagnosis not present

## 2018-06-30 DIAGNOSIS — R3129 Other microscopic hematuria: Secondary | ICD-10-CM

## 2018-07-09 DIAGNOSIS — D225 Melanocytic nevi of trunk: Secondary | ICD-10-CM | POA: Diagnosis not present

## 2018-07-09 DIAGNOSIS — L821 Other seborrheic keratosis: Secondary | ICD-10-CM | POA: Diagnosis not present

## 2018-07-09 DIAGNOSIS — Z85828 Personal history of other malignant neoplasm of skin: Secondary | ICD-10-CM | POA: Diagnosis not present

## 2018-07-09 DIAGNOSIS — M310 Hypersensitivity angiitis: Secondary | ICD-10-CM | POA: Diagnosis not present

## 2018-07-09 DIAGNOSIS — D2272 Melanocytic nevi of left lower limb, including hip: Secondary | ICD-10-CM | POA: Diagnosis not present

## 2018-07-09 DIAGNOSIS — D2271 Melanocytic nevi of right lower limb, including hip: Secondary | ICD-10-CM | POA: Diagnosis not present

## 2018-07-09 DIAGNOSIS — D2262 Melanocytic nevi of left upper limb, including shoulder: Secondary | ICD-10-CM | POA: Diagnosis not present

## 2018-07-09 DIAGNOSIS — D2261 Melanocytic nevi of right upper limb, including shoulder: Secondary | ICD-10-CM | POA: Diagnosis not present

## 2018-07-09 DIAGNOSIS — L720 Epidermal cyst: Secondary | ICD-10-CM | POA: Diagnosis not present

## 2018-09-29 DIAGNOSIS — D122 Benign neoplasm of ascending colon: Secondary | ICD-10-CM | POA: Diagnosis not present

## 2018-09-29 DIAGNOSIS — Z86010 Personal history of colonic polyps: Secondary | ICD-10-CM | POA: Diagnosis not present

## 2018-10-01 ENCOUNTER — Other Ambulatory Visit: Payer: Self-pay | Admitting: Internal Medicine

## 2018-10-01 DIAGNOSIS — Z1231 Encounter for screening mammogram for malignant neoplasm of breast: Secondary | ICD-10-CM

## 2018-10-02 DIAGNOSIS — D122 Benign neoplasm of ascending colon: Secondary | ICD-10-CM | POA: Diagnosis not present

## 2018-10-13 DIAGNOSIS — R3129 Other microscopic hematuria: Secondary | ICD-10-CM | POA: Diagnosis not present

## 2018-10-22 DIAGNOSIS — Z23 Encounter for immunization: Secondary | ICD-10-CM | POA: Diagnosis not present

## 2018-10-22 DIAGNOSIS — R7303 Prediabetes: Secondary | ICD-10-CM | POA: Diagnosis not present

## 2018-10-22 DIAGNOSIS — M310 Hypersensitivity angiitis: Secondary | ICD-10-CM | POA: Diagnosis not present

## 2018-10-22 DIAGNOSIS — R030 Elevated blood-pressure reading, without diagnosis of hypertension: Secondary | ICD-10-CM | POA: Diagnosis not present

## 2018-10-22 DIAGNOSIS — D649 Anemia, unspecified: Secondary | ICD-10-CM | POA: Diagnosis not present

## 2018-10-22 DIAGNOSIS — E875 Hyperkalemia: Secondary | ICD-10-CM | POA: Diagnosis not present

## 2018-10-22 DIAGNOSIS — R3129 Other microscopic hematuria: Secondary | ICD-10-CM | POA: Diagnosis not present

## 2018-10-30 DIAGNOSIS — R030 Elevated blood-pressure reading, without diagnosis of hypertension: Secondary | ICD-10-CM | POA: Diagnosis not present

## 2018-11-06 DIAGNOSIS — Z01419 Encounter for gynecological examination (general) (routine) without abnormal findings: Secondary | ICD-10-CM | POA: Diagnosis not present

## 2018-12-07 DIAGNOSIS — D485 Neoplasm of uncertain behavior of skin: Secondary | ICD-10-CM | POA: Diagnosis not present

## 2018-12-07 DIAGNOSIS — L821 Other seborrheic keratosis: Secondary | ICD-10-CM | POA: Diagnosis not present

## 2018-12-07 DIAGNOSIS — L718 Other rosacea: Secondary | ICD-10-CM | POA: Diagnosis not present

## 2018-12-07 DIAGNOSIS — L820 Inflamed seborrheic keratosis: Secondary | ICD-10-CM | POA: Diagnosis not present

## 2018-12-07 DIAGNOSIS — Z85828 Personal history of other malignant neoplasm of skin: Secondary | ICD-10-CM | POA: Diagnosis not present

## 2018-12-07 DIAGNOSIS — D225 Melanocytic nevi of trunk: Secondary | ICD-10-CM | POA: Diagnosis not present

## 2019-01-11 ENCOUNTER — Other Ambulatory Visit: Payer: Self-pay

## 2019-01-11 ENCOUNTER — Ambulatory Visit
Admission: RE | Admit: 2019-01-11 | Discharge: 2019-01-11 | Disposition: A | Discharge status: Home / Self Care | Payer: Medicare Other | Source: Ambulatory Visit | Attending: Internal Medicine | Admitting: Internal Medicine

## 2019-01-11 DIAGNOSIS — Z1231 Encounter for screening mammogram for malignant neoplasm of breast: Secondary | ICD-10-CM

## 2019-04-06 DIAGNOSIS — R3129 Other microscopic hematuria: Secondary | ICD-10-CM | POA: Diagnosis not present

## 2019-04-06 DIAGNOSIS — R030 Elevated blood-pressure reading, without diagnosis of hypertension: Secondary | ICD-10-CM | POA: Diagnosis not present

## 2019-04-06 DIAGNOSIS — E875 Hyperkalemia: Secondary | ICD-10-CM | POA: Diagnosis not present

## 2019-04-08 DIAGNOSIS — R7309 Other abnormal glucose: Secondary | ICD-10-CM | POA: Diagnosis not present

## 2019-04-08 DIAGNOSIS — M85859 Other specified disorders of bone density and structure, unspecified thigh: Secondary | ICD-10-CM | POA: Diagnosis not present

## 2019-04-08 DIAGNOSIS — Z1389 Encounter for screening for other disorder: Secondary | ICD-10-CM | POA: Diagnosis not present

## 2019-04-08 DIAGNOSIS — L209 Atopic dermatitis, unspecified: Secondary | ICD-10-CM | POA: Diagnosis not present

## 2019-04-08 DIAGNOSIS — G35 Multiple sclerosis: Secondary | ICD-10-CM | POA: Diagnosis not present

## 2019-04-08 DIAGNOSIS — M1990 Unspecified osteoarthritis, unspecified site: Secondary | ICD-10-CM | POA: Diagnosis not present

## 2019-04-08 DIAGNOSIS — Z0000 Encounter for general adult medical examination without abnormal findings: Secondary | ICD-10-CM | POA: Diagnosis not present

## 2019-04-08 DIAGNOSIS — E785 Hyperlipidemia, unspecified: Secondary | ICD-10-CM | POA: Diagnosis not present

## 2019-04-08 DIAGNOSIS — Z79899 Other long term (current) drug therapy: Secondary | ICD-10-CM | POA: Diagnosis not present

## 2019-04-15 DIAGNOSIS — M310 Hypersensitivity angiitis: Secondary | ICD-10-CM | POA: Diagnosis not present

## 2019-04-15 DIAGNOSIS — R7303 Prediabetes: Secondary | ICD-10-CM | POA: Diagnosis not present

## 2019-04-15 DIAGNOSIS — R3129 Other microscopic hematuria: Secondary | ICD-10-CM | POA: Diagnosis not present

## 2019-04-15 DIAGNOSIS — E875 Hyperkalemia: Secondary | ICD-10-CM | POA: Diagnosis not present

## 2019-04-15 DIAGNOSIS — R030 Elevated blood-pressure reading, without diagnosis of hypertension: Secondary | ICD-10-CM | POA: Diagnosis not present

## 2019-04-19 ENCOUNTER — Other Ambulatory Visit: Payer: Self-pay | Admitting: Internal Medicine

## 2019-04-19 DIAGNOSIS — M85859 Other specified disorders of bone density and structure, unspecified thigh: Secondary | ICD-10-CM

## 2019-05-31 DIAGNOSIS — D2271 Melanocytic nevi of right lower limb, including hip: Secondary | ICD-10-CM | POA: Diagnosis not present

## 2019-05-31 DIAGNOSIS — C4441 Basal cell carcinoma of skin of scalp and neck: Secondary | ICD-10-CM | POA: Diagnosis not present

## 2019-05-31 DIAGNOSIS — L821 Other seborrheic keratosis: Secondary | ICD-10-CM | POA: Diagnosis not present

## 2019-05-31 DIAGNOSIS — L309 Dermatitis, unspecified: Secondary | ICD-10-CM | POA: Diagnosis not present

## 2019-05-31 DIAGNOSIS — D485 Neoplasm of uncertain behavior of skin: Secondary | ICD-10-CM | POA: Diagnosis not present

## 2019-05-31 DIAGNOSIS — C44319 Basal cell carcinoma of skin of other parts of face: Secondary | ICD-10-CM | POA: Diagnosis not present

## 2019-05-31 DIAGNOSIS — D225 Melanocytic nevi of trunk: Secondary | ICD-10-CM | POA: Diagnosis not present

## 2019-05-31 DIAGNOSIS — Z85828 Personal history of other malignant neoplasm of skin: Secondary | ICD-10-CM | POA: Diagnosis not present

## 2019-05-31 DIAGNOSIS — L718 Other rosacea: Secondary | ICD-10-CM | POA: Diagnosis not present

## 2019-05-31 DIAGNOSIS — D2272 Melanocytic nevi of left lower limb, including hip: Secondary | ICD-10-CM | POA: Diagnosis not present

## 2019-06-16 DIAGNOSIS — C44319 Basal cell carcinoma of skin of other parts of face: Secondary | ICD-10-CM | POA: Diagnosis not present

## 2019-06-16 DIAGNOSIS — Z85828 Personal history of other malignant neoplasm of skin: Secondary | ICD-10-CM | POA: Diagnosis not present

## 2019-10-20 ENCOUNTER — Other Ambulatory Visit: Payer: Self-pay | Admitting: Internal Medicine

## 2019-10-20 DIAGNOSIS — Z1231 Encounter for screening mammogram for malignant neoplasm of breast: Secondary | ICD-10-CM

## 2019-11-10 DIAGNOSIS — Z23 Encounter for immunization: Secondary | ICD-10-CM | POA: Diagnosis not present

## 2019-11-30 DIAGNOSIS — D225 Melanocytic nevi of trunk: Secondary | ICD-10-CM | POA: Diagnosis not present

## 2019-11-30 DIAGNOSIS — L821 Other seborrheic keratosis: Secondary | ICD-10-CM | POA: Diagnosis not present

## 2019-11-30 DIAGNOSIS — D2261 Melanocytic nevi of right upper limb, including shoulder: Secondary | ICD-10-CM | POA: Diagnosis not present

## 2019-11-30 DIAGNOSIS — L820 Inflamed seborrheic keratosis: Secondary | ICD-10-CM | POA: Diagnosis not present

## 2019-11-30 DIAGNOSIS — L84 Corns and callosities: Secondary | ICD-10-CM | POA: Diagnosis not present

## 2019-11-30 DIAGNOSIS — D485 Neoplasm of uncertain behavior of skin: Secondary | ICD-10-CM | POA: Diagnosis not present

## 2019-11-30 DIAGNOSIS — Z85828 Personal history of other malignant neoplasm of skin: Secondary | ICD-10-CM | POA: Diagnosis not present

## 2019-11-30 DIAGNOSIS — L720 Epidermal cyst: Secondary | ICD-10-CM | POA: Diagnosis not present

## 2019-11-30 DIAGNOSIS — D2262 Melanocytic nevi of left upper limb, including shoulder: Secondary | ICD-10-CM | POA: Diagnosis not present

## 2020-01-12 ENCOUNTER — Ambulatory Visit
Admission: RE | Admit: 2020-01-12 | Discharge: 2020-01-12 | Disposition: A | Discharge status: Home / Self Care | Payer: Medicare Other | Source: Ambulatory Visit | Attending: Internal Medicine | Admitting: Internal Medicine

## 2020-01-12 ENCOUNTER — Other Ambulatory Visit: Payer: Self-pay

## 2020-01-12 DIAGNOSIS — Z1231 Encounter for screening mammogram for malignant neoplasm of breast: Secondary | ICD-10-CM | POA: Diagnosis not present

## 2020-01-18 DIAGNOSIS — Z23 Encounter for immunization: Secondary | ICD-10-CM | POA: Diagnosis not present

## 2020-01-20 DIAGNOSIS — E785 Hyperlipidemia, unspecified: Secondary | ICD-10-CM | POA: Diagnosis not present

## 2020-01-20 DIAGNOSIS — N6002 Solitary cyst of left breast: Secondary | ICD-10-CM | POA: Diagnosis not present

## 2020-01-20 DIAGNOSIS — R030 Elevated blood-pressure reading, without diagnosis of hypertension: Secondary | ICD-10-CM | POA: Diagnosis not present

## 2020-03-13 IMAGING — DX DG CHEST 2V
2 series · 2 of 2 positions shown · non-contrast
Comparison: None.

CLINICAL DATA: Bilateral leg rash and swelling since [REDACTED].
Tightness to the calves and itching in front of the legs.

EXAM:
CHEST - 2 VIEW

[chest pa]
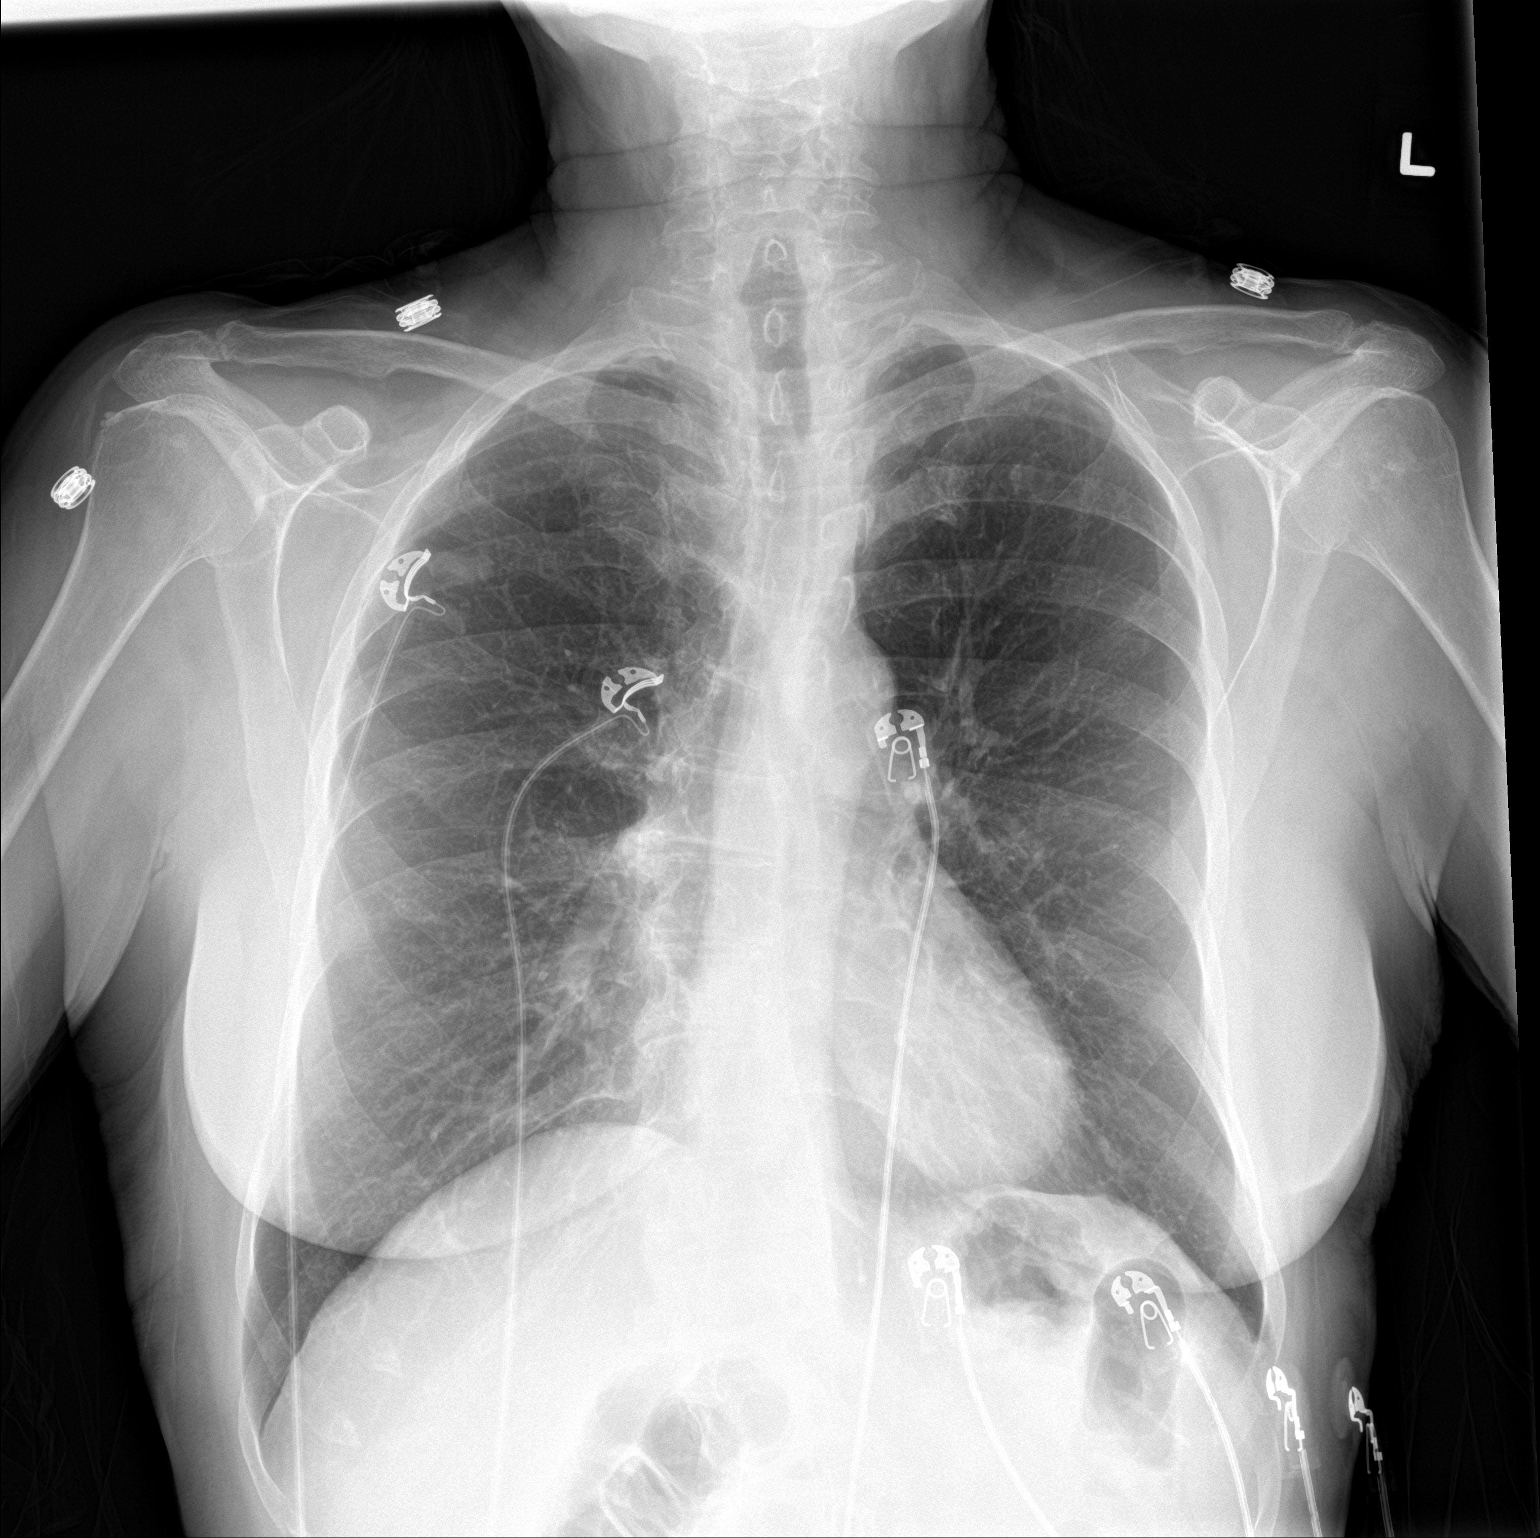

[chest lat]
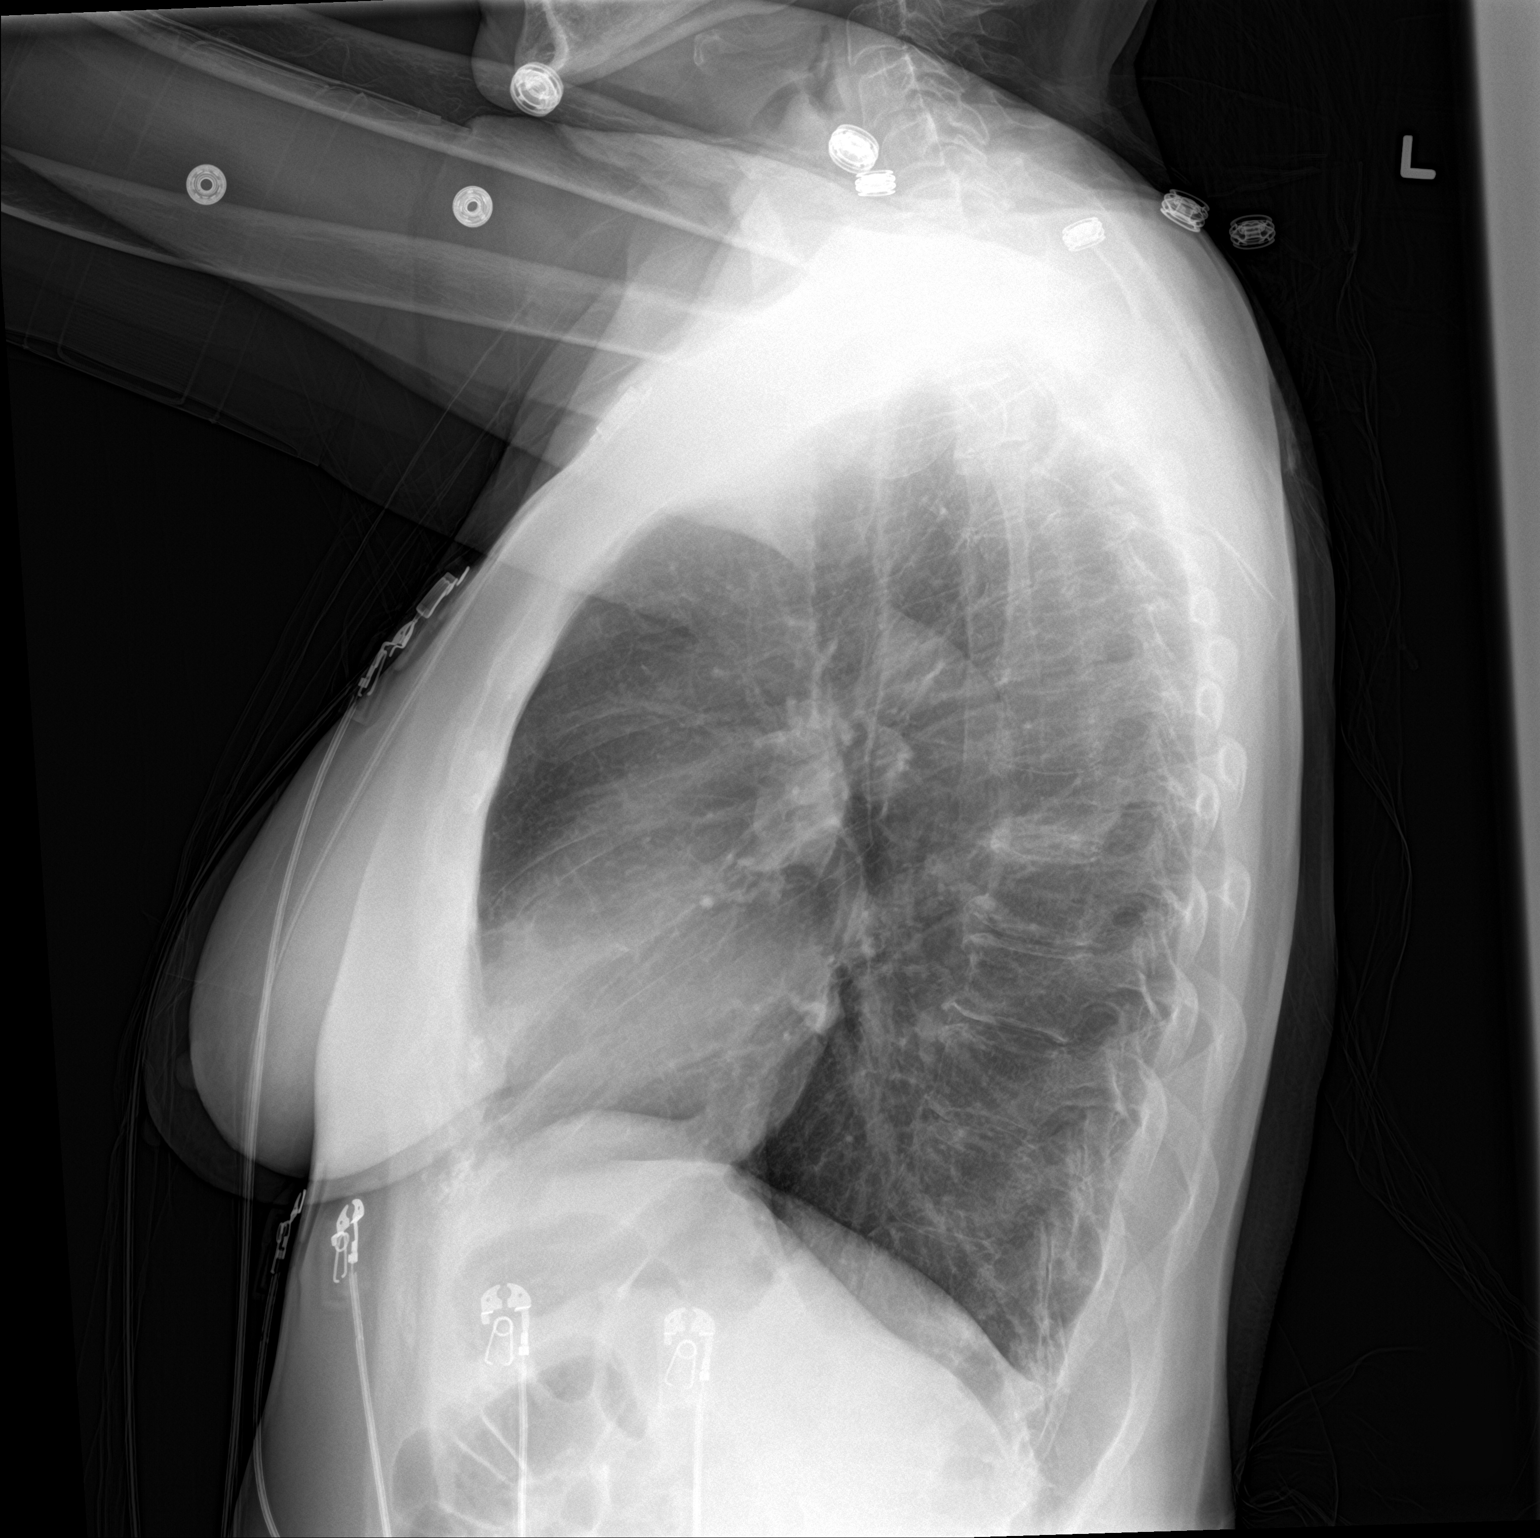

[2 of 2 positions shown; findings below may reference images not displayed]

FINDINGS: Hyperinflation. Normal heart size and pulmonary vascularity. No
focal airspace disease or consolidation in the lungs. No blunting of
costophrenic angles. No pneumothorax. Mediastinal contours appear
intact.
IMPRESSION: No active cardiopulmonary disease.

## 2020-04-11 DIAGNOSIS — L209 Atopic dermatitis, unspecified: Secondary | ICD-10-CM | POA: Diagnosis not present

## 2020-04-11 DIAGNOSIS — M1990 Unspecified osteoarthritis, unspecified site: Secondary | ICD-10-CM | POA: Diagnosis not present

## 2020-04-11 DIAGNOSIS — I776 Arteritis, unspecified: Secondary | ICD-10-CM | POA: Diagnosis not present

## 2020-04-11 DIAGNOSIS — Z7189 Other specified counseling: Secondary | ICD-10-CM | POA: Diagnosis not present

## 2020-04-11 DIAGNOSIS — R030 Elevated blood-pressure reading, without diagnosis of hypertension: Secondary | ICD-10-CM | POA: Diagnosis not present

## 2020-04-11 DIAGNOSIS — Z0000 Encounter for general adult medical examination without abnormal findings: Secondary | ICD-10-CM | POA: Diagnosis not present

## 2020-04-11 DIAGNOSIS — M85859 Other specified disorders of bone density and structure, unspecified thigh: Secondary | ICD-10-CM | POA: Diagnosis not present

## 2020-04-11 DIAGNOSIS — Z1389 Encounter for screening for other disorder: Secondary | ICD-10-CM | POA: Diagnosis not present

## 2020-04-11 DIAGNOSIS — E875 Hyperkalemia: Secondary | ICD-10-CM | POA: Diagnosis not present

## 2020-04-11 DIAGNOSIS — Z23 Encounter for immunization: Secondary | ICD-10-CM | POA: Diagnosis not present

## 2020-04-11 DIAGNOSIS — E785 Hyperlipidemia, unspecified: Secondary | ICD-10-CM | POA: Diagnosis not present

## 2020-04-11 DIAGNOSIS — R7309 Other abnormal glucose: Secondary | ICD-10-CM | POA: Diagnosis not present

## 2020-04-12 ENCOUNTER — Other Ambulatory Visit: Payer: Self-pay | Admitting: Internal Medicine

## 2020-04-12 DIAGNOSIS — M85859 Other specified disorders of bone density and structure, unspecified thigh: Secondary | ICD-10-CM

## 2020-05-15 DIAGNOSIS — D2261 Melanocytic nevi of right upper limb, including shoulder: Secondary | ICD-10-CM | POA: Diagnosis not present

## 2020-05-15 DIAGNOSIS — D2262 Melanocytic nevi of left upper limb, including shoulder: Secondary | ICD-10-CM | POA: Diagnosis not present

## 2020-05-15 DIAGNOSIS — L718 Other rosacea: Secondary | ICD-10-CM | POA: Diagnosis not present

## 2020-05-15 DIAGNOSIS — Z85828 Personal history of other malignant neoplasm of skin: Secondary | ICD-10-CM | POA: Diagnosis not present

## 2020-05-15 DIAGNOSIS — D2271 Melanocytic nevi of right lower limb, including hip: Secondary | ICD-10-CM | POA: Diagnosis not present

## 2020-05-15 DIAGNOSIS — D2372 Other benign neoplasm of skin of left lower limb, including hip: Secondary | ICD-10-CM | POA: Diagnosis not present

## 2020-05-15 DIAGNOSIS — D2272 Melanocytic nevi of left lower limb, including hip: Secondary | ICD-10-CM | POA: Diagnosis not present

## 2020-05-15 DIAGNOSIS — L821 Other seborrheic keratosis: Secondary | ICD-10-CM | POA: Diagnosis not present

## 2020-05-15 DIAGNOSIS — L720 Epidermal cyst: Secondary | ICD-10-CM | POA: Diagnosis not present

## 2020-05-15 DIAGNOSIS — D225 Melanocytic nevi of trunk: Secondary | ICD-10-CM | POA: Diagnosis not present

## 2020-08-07 DIAGNOSIS — R350 Frequency of micturition: Secondary | ICD-10-CM | POA: Diagnosis not present

## 2020-08-07 DIAGNOSIS — R1032 Left lower quadrant pain: Secondary | ICD-10-CM | POA: Diagnosis not present

## 2020-08-07 DIAGNOSIS — K589 Irritable bowel syndrome without diarrhea: Secondary | ICD-10-CM | POA: Diagnosis not present

## 2020-08-07 DIAGNOSIS — R1031 Right lower quadrant pain: Secondary | ICD-10-CM | POA: Diagnosis not present

## 2020-09-11 ENCOUNTER — Other Ambulatory Visit: Payer: Self-pay

## 2020-09-11 ENCOUNTER — Ambulatory Visit
Admission: RE | Admit: 2020-09-11 | Discharge: 2020-09-11 | Disposition: A | Discharge status: Home / Self Care | Payer: Medicare Other | Source: Ambulatory Visit | Attending: Internal Medicine | Admitting: Internal Medicine

## 2020-09-11 DIAGNOSIS — Z780 Asymptomatic menopausal state: Secondary | ICD-10-CM | POA: Diagnosis not present

## 2020-09-11 DIAGNOSIS — M85859 Other specified disorders of bone density and structure, unspecified thigh: Secondary | ICD-10-CM

## 2020-09-11 DIAGNOSIS — M810 Age-related osteoporosis without current pathological fracture: Secondary | ICD-10-CM | POA: Diagnosis not present

## 2020-10-26 ENCOUNTER — Other Ambulatory Visit: Payer: Self-pay | Admitting: Internal Medicine

## 2020-10-26 DIAGNOSIS — Z1231 Encounter for screening mammogram for malignant neoplasm of breast: Secondary | ICD-10-CM

## 2020-11-06 DIAGNOSIS — Z01419 Encounter for gynecological examination (general) (routine) without abnormal findings: Secondary | ICD-10-CM | POA: Diagnosis not present

## 2020-11-08 DIAGNOSIS — Z23 Encounter for immunization: Secondary | ICD-10-CM | POA: Diagnosis not present

## 2020-11-14 DIAGNOSIS — L570 Actinic keratosis: Secondary | ICD-10-CM | POA: Diagnosis not present

## 2020-11-14 DIAGNOSIS — D2262 Melanocytic nevi of left upper limb, including shoulder: Secondary | ICD-10-CM | POA: Diagnosis not present

## 2020-11-14 DIAGNOSIS — C44311 Basal cell carcinoma of skin of nose: Secondary | ICD-10-CM | POA: Diagnosis not present

## 2020-11-14 DIAGNOSIS — Z85828 Personal history of other malignant neoplasm of skin: Secondary | ICD-10-CM | POA: Diagnosis not present

## 2020-11-14 DIAGNOSIS — D2271 Melanocytic nevi of right lower limb, including hip: Secondary | ICD-10-CM | POA: Diagnosis not present

## 2020-11-14 DIAGNOSIS — D225 Melanocytic nevi of trunk: Secondary | ICD-10-CM | POA: Diagnosis not present

## 2020-11-14 DIAGNOSIS — L821 Other seborrheic keratosis: Secondary | ICD-10-CM | POA: Diagnosis not present

## 2020-11-14 DIAGNOSIS — L738 Other specified follicular disorders: Secondary | ICD-10-CM | POA: Diagnosis not present

## 2020-11-14 DIAGNOSIS — D2261 Melanocytic nevi of right upper limb, including shoulder: Secondary | ICD-10-CM | POA: Diagnosis not present

## 2020-11-14 DIAGNOSIS — D2272 Melanocytic nevi of left lower limb, including hip: Secondary | ICD-10-CM | POA: Diagnosis not present

## 2020-11-14 DIAGNOSIS — L309 Dermatitis, unspecified: Secondary | ICD-10-CM | POA: Diagnosis not present

## 2020-11-14 DIAGNOSIS — D485 Neoplasm of uncertain behavior of skin: Secondary | ICD-10-CM | POA: Diagnosis not present

## 2020-12-12 DIAGNOSIS — C44311 Basal cell carcinoma of skin of nose: Secondary | ICD-10-CM | POA: Diagnosis not present

## 2020-12-12 DIAGNOSIS — Z85828 Personal history of other malignant neoplasm of skin: Secondary | ICD-10-CM | POA: Diagnosis not present

## 2021-01-15 ENCOUNTER — Ambulatory Visit
Admission: RE | Admit: 2021-01-15 | Discharge: 2021-01-15 | Disposition: A | Discharge status: Home / Self Care | Payer: Medicare Other | Source: Ambulatory Visit | Attending: Internal Medicine | Admitting: Internal Medicine

## 2021-01-15 DIAGNOSIS — Z1231 Encounter for screening mammogram for malignant neoplasm of breast: Secondary | ICD-10-CM | POA: Diagnosis not present

## 2021-05-11 DIAGNOSIS — M810 Age-related osteoporosis without current pathological fracture: Secondary | ICD-10-CM | POA: Diagnosis not present

## 2021-05-11 DIAGNOSIS — R7301 Impaired fasting glucose: Secondary | ICD-10-CM | POA: Diagnosis not present

## 2021-05-11 DIAGNOSIS — Z1389 Encounter for screening for other disorder: Secondary | ICD-10-CM | POA: Diagnosis not present

## 2021-05-11 DIAGNOSIS — C4491 Basal cell carcinoma of skin, unspecified: Secondary | ICD-10-CM | POA: Diagnosis not present

## 2021-05-11 DIAGNOSIS — E785 Hyperlipidemia, unspecified: Secondary | ICD-10-CM | POA: Diagnosis not present

## 2021-05-11 DIAGNOSIS — K589 Irritable bowel syndrome without diarrhea: Secondary | ICD-10-CM | POA: Diagnosis not present

## 2021-05-11 DIAGNOSIS — Z0000 Encounter for general adult medical examination without abnormal findings: Secondary | ICD-10-CM | POA: Diagnosis not present

## 2021-05-15 DIAGNOSIS — D485 Neoplasm of uncertain behavior of skin: Secondary | ICD-10-CM | POA: Diagnosis not present

## 2021-05-15 DIAGNOSIS — D2272 Melanocytic nevi of left lower limb, including hip: Secondary | ICD-10-CM | POA: Diagnosis not present

## 2021-05-15 DIAGNOSIS — D2271 Melanocytic nevi of right lower limb, including hip: Secondary | ICD-10-CM | POA: Diagnosis not present

## 2021-05-15 DIAGNOSIS — L821 Other seborrheic keratosis: Secondary | ICD-10-CM | POA: Diagnosis not present

## 2021-05-15 DIAGNOSIS — C44319 Basal cell carcinoma of skin of other parts of face: Secondary | ICD-10-CM | POA: Diagnosis not present

## 2021-05-15 DIAGNOSIS — D2372 Other benign neoplasm of skin of left lower limb, including hip: Secondary | ICD-10-CM | POA: Diagnosis not present

## 2021-05-15 DIAGNOSIS — D225 Melanocytic nevi of trunk: Secondary | ICD-10-CM | POA: Diagnosis not present

## 2021-05-15 DIAGNOSIS — Z85828 Personal history of other malignant neoplasm of skin: Secondary | ICD-10-CM | POA: Diagnosis not present

## 2021-06-14 DIAGNOSIS — K589 Irritable bowel syndrome without diarrhea: Secondary | ICD-10-CM | POA: Diagnosis not present

## 2021-06-14 DIAGNOSIS — E785 Hyperlipidemia, unspecified: Secondary | ICD-10-CM | POA: Diagnosis not present

## 2021-06-14 DIAGNOSIS — M810 Age-related osteoporosis without current pathological fracture: Secondary | ICD-10-CM | POA: Diagnosis not present

## 2021-06-20 DIAGNOSIS — Z85828 Personal history of other malignant neoplasm of skin: Secondary | ICD-10-CM | POA: Diagnosis not present

## 2021-06-20 DIAGNOSIS — C44319 Basal cell carcinoma of skin of other parts of face: Secondary | ICD-10-CM | POA: Diagnosis not present

## 2021-08-06 ENCOUNTER — Ambulatory Visit (INDEPENDENT_AMBULATORY_CARE_PROVIDER_SITE_OTHER): Payer: Medicare Other | Admitting: Podiatry

## 2021-08-06 DIAGNOSIS — B070 Plantar wart: Secondary | ICD-10-CM

## 2021-08-12 NOTE — Progress Notes (Signed)
Subjective:   Patient ID: Audrey Nelson, female   DOB: 76 y.o.   MRN: [id number]   HPI 76 year old female presents the office today with concerns of a skin lesion above her left foot which started March 2023.  She said the last dermatologist cut out the wart.  Is come back causing discomfort and feels that she is stepping on rocks.  No swelling redness or drainage.  Denies of any foreign objects.   Review of Systems  All other systems reviewed and are negative.  No past medical history on file.  Past Surgical History:  Procedure Laterality Date  . BREAST EXCISIONAL BIOPSY Left      Current Outpatient Medications:  .  aspirin 81 MG tablet, Take 81 mg by mouth once., Disp: , Rfl:  .  CALCIUM PO, Take 1 tablet by mouth daily., Disp: , Rfl:  .  Multiple Vitamin (MULTIVITAMIN) tablet, Take 1 tablet by mouth daily., Disp: , Rfl:   No Known Allergies        Objective:  Physical Exam  General: AAO x3, NAD  Dermatological: On the plantar aspect of the left foot is a punctate annular hyperkeratotic lesion.  Small amount of capillary budding is present consistent with verruca.  There is no erythema, ascending cellulitis with no drainage or pus.  No underlying foreign body or puncture wound.  Vascular: Dorsalis Pedis artery and Posterior Tibial artery pedal pulses are 2/4 bilateral with immedate capillary fill time. There is no pain with calf compression, swelling, warmth, erythema.   Neruologic: Grossly intact via light touch bilateral.   Musculoskeletal: Tenderness along the skin lesion.  No other areas of discomfort.  Muscular strength 5/5 in all groups tested bilateral.  Gait: Unassisted, Nonantalgic.       Assessment:   Plantar wart left foot     Plan:  -Treatment options discussed including all alternatives, risks, and complications -Etiology of symptoms were discussed -Skin lesion was sharply debrided without any complications.  Cleaned with alcohol.  Cantharone Plus  was applied followed by an occlusive bandage.  Postprocedure instructions discussed.  Monitor for any signs or symptoms of infection.   Trula Slade DPM

## 2021-10-24 ENCOUNTER — Other Ambulatory Visit: Payer: Self-pay | Admitting: Internal Medicine

## 2021-10-24 DIAGNOSIS — Z1231 Encounter for screening mammogram for malignant neoplasm of breast: Secondary | ICD-10-CM

## 2021-11-15 DIAGNOSIS — L718 Other rosacea: Secondary | ICD-10-CM | POA: Diagnosis not present

## 2021-11-15 DIAGNOSIS — D2271 Melanocytic nevi of right lower limb, including hip: Secondary | ICD-10-CM | POA: Diagnosis not present

## 2021-11-15 DIAGNOSIS — Z85828 Personal history of other malignant neoplasm of skin: Secondary | ICD-10-CM | POA: Diagnosis not present

## 2021-11-15 DIAGNOSIS — L821 Other seborrheic keratosis: Secondary | ICD-10-CM | POA: Diagnosis not present

## 2021-11-15 DIAGNOSIS — D2261 Melanocytic nevi of right upper limb, including shoulder: Secondary | ICD-10-CM | POA: Diagnosis not present

## 2021-11-15 DIAGNOSIS — D2372 Other benign neoplasm of skin of left lower limb, including hip: Secondary | ICD-10-CM | POA: Diagnosis not present

## 2021-11-15 DIAGNOSIS — D2272 Melanocytic nevi of left lower limb, including hip: Secondary | ICD-10-CM | POA: Diagnosis not present

## 2021-11-15 DIAGNOSIS — D2262 Melanocytic nevi of left upper limb, including shoulder: Secondary | ICD-10-CM | POA: Diagnosis not present

## 2021-11-15 DIAGNOSIS — L720 Epidermal cyst: Secondary | ICD-10-CM | POA: Diagnosis not present

## 2021-11-15 DIAGNOSIS — D225 Melanocytic nevi of trunk: Secondary | ICD-10-CM | POA: Diagnosis not present

## 2021-11-24 DIAGNOSIS — Z23 Encounter for immunization: Secondary | ICD-10-CM | POA: Diagnosis not present

## 2021-12-06 IMAGING — MG DIGITAL SCREENING BILAT W/ TOMO W/ CAD
8 series · 8 of 24 positions shown · non-contrast
Comparison: Previous exam(s).

CLINICAL DATA: Screening.

EXAM:
DIGITAL SCREENING BILATERAL MAMMOGRAM WITH TOMO AND CAD

[R MLO synth-2D]
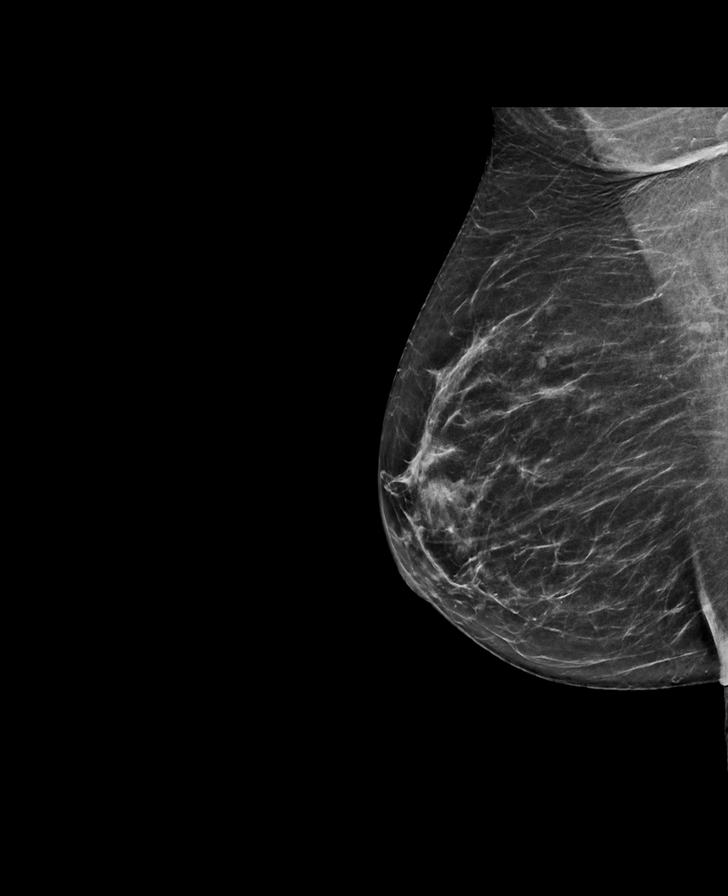

[L MLO synth-2D]
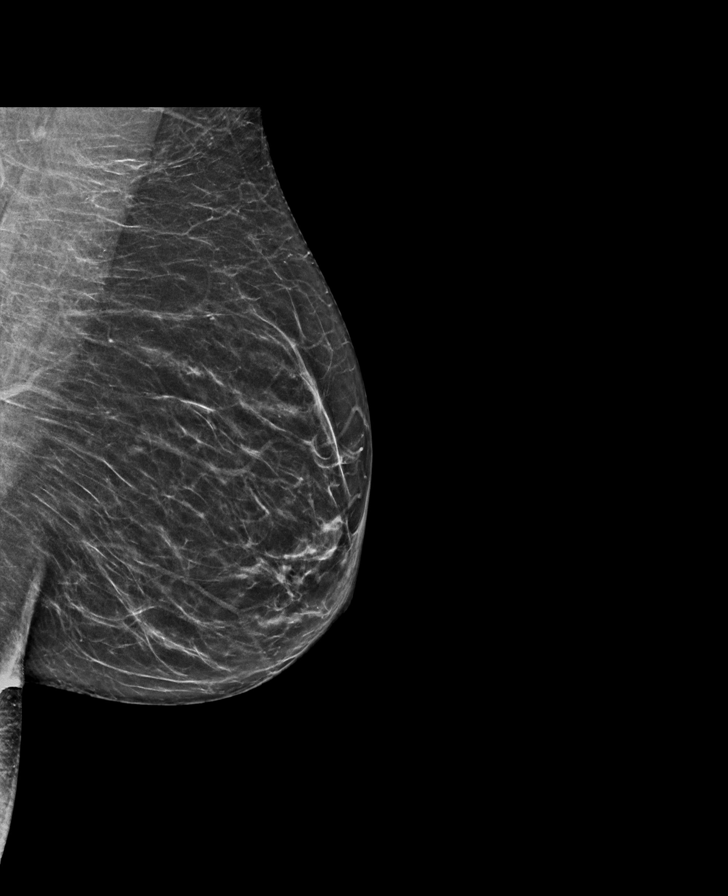

[L CC synth-2D]
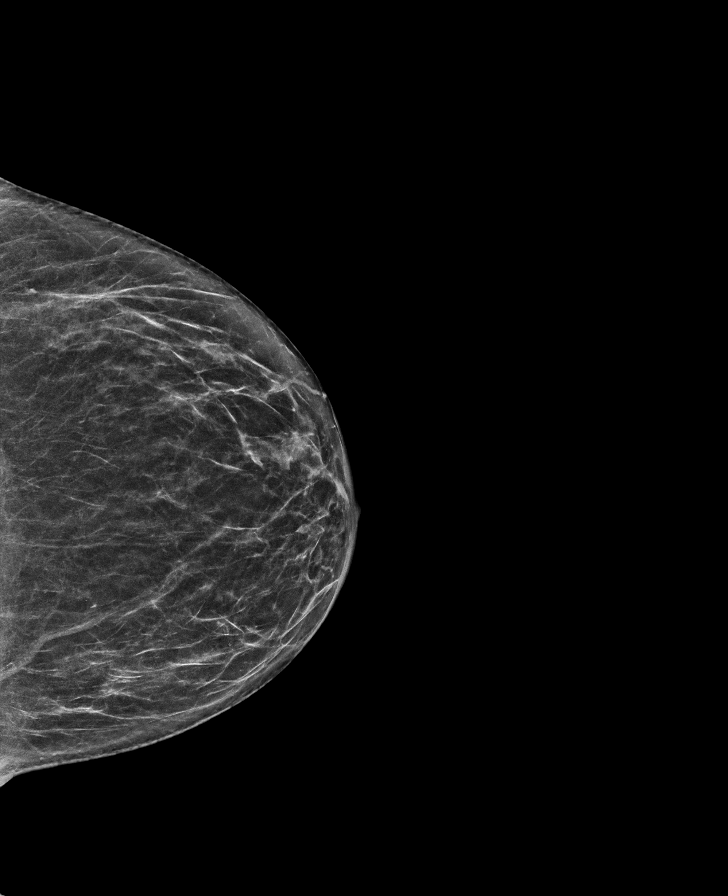

[R CC synth-2D]
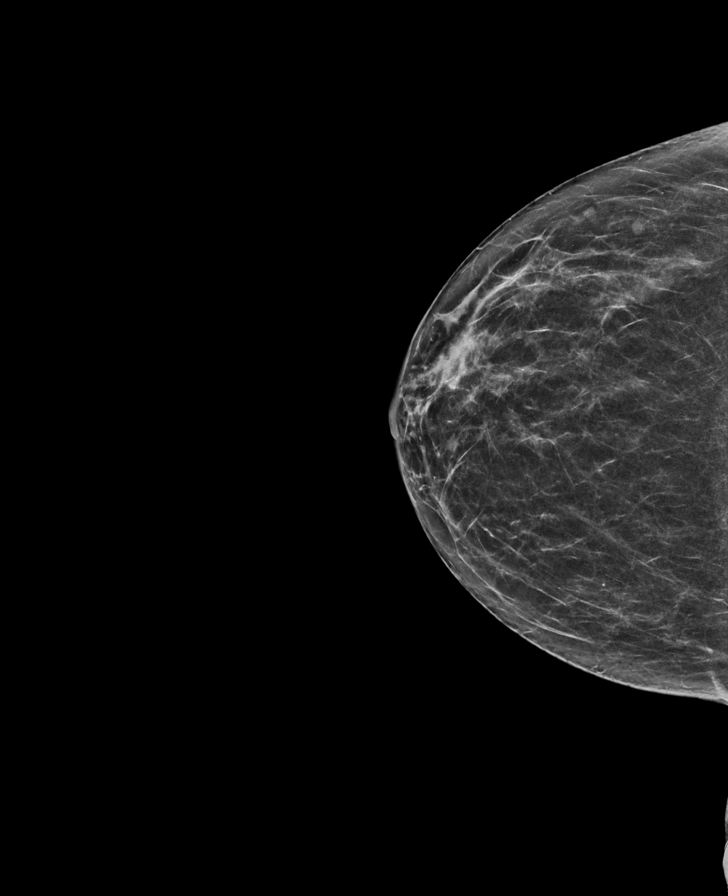

[L CC tomo · tomo slice 30/59.0]
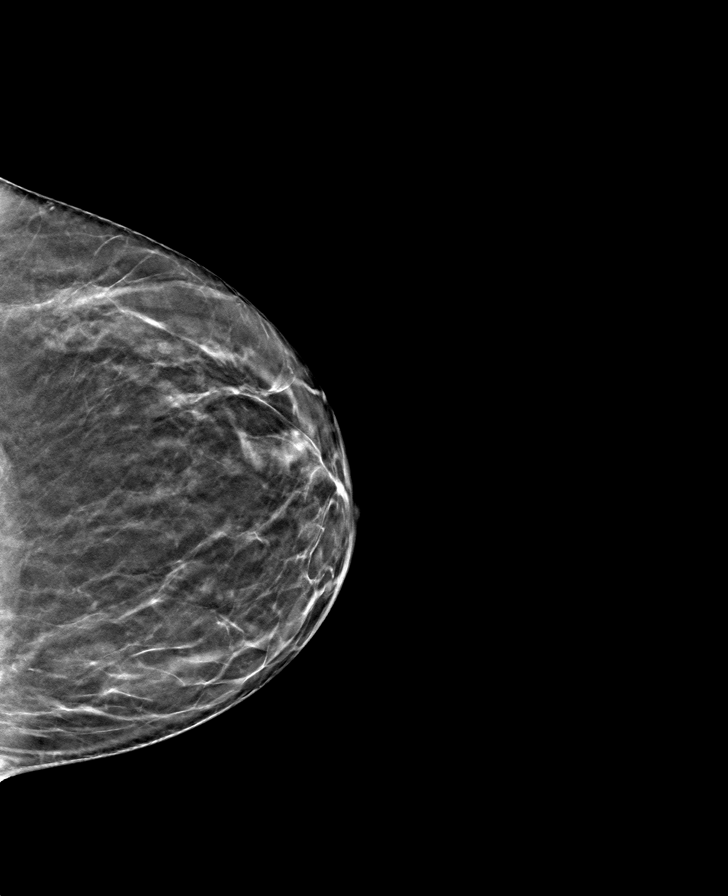

[L MLO tomo · tomo slice 31/62.0]
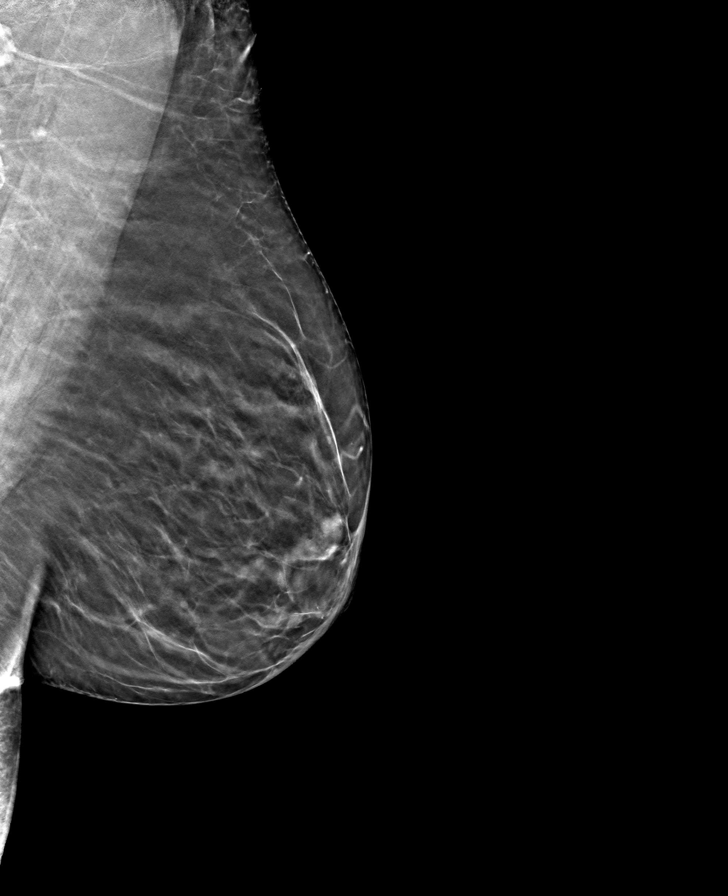

[R CC tomo · tomo slice 29/57.0]
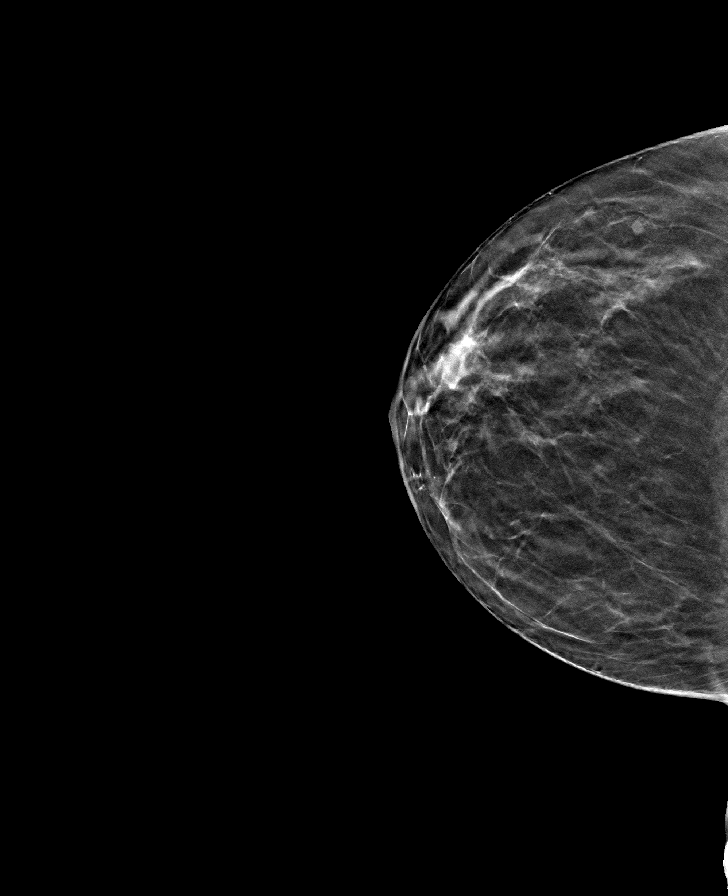

[R MLO tomo · tomo slice 33/64.0]
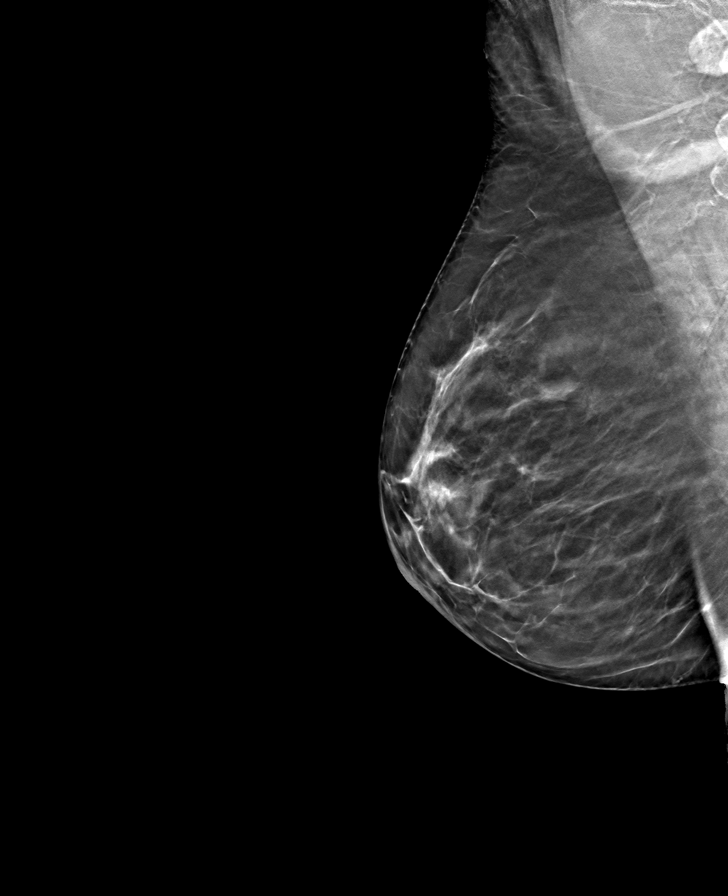

[8 of 24 positions shown; findings below may reference images not displayed]

ACR Breast Density Category c: The breast tissue is heterogeneously
dense, which may obscure small masses.
FINDINGS: There are no findings suspicious for malignancy. Images were
processed with CAD.
IMPRESSION: No mammographic evidence of malignancy. A result letter of this
screening mammogram will be mailed directly to the patient.

RECOMMENDATION:
Screening mammogram in one year. (Code:FT-U-LHB)

BI-RADS CATEGORY  1: Negative.

## 2022-01-16 ENCOUNTER — Ambulatory Visit: Payer: Medicare Other

## 2022-01-25 ENCOUNTER — Ambulatory Visit
Admission: RE | Admit: 2022-01-25 | Discharge: 2022-01-25 | Disposition: A | Discharge status: Home / Self Care | Payer: Medicare Other | Source: Ambulatory Visit | Attending: Internal Medicine | Admitting: Internal Medicine

## 2022-01-25 DIAGNOSIS — Z1231 Encounter for screening mammogram for malignant neoplasm of breast: Secondary | ICD-10-CM

## 2022-05-16 DIAGNOSIS — Z86010 Personal history of colonic polyps: Secondary | ICD-10-CM | POA: Diagnosis not present

## 2022-05-16 DIAGNOSIS — Z1331 Encounter for screening for depression: Secondary | ICD-10-CM | POA: Diagnosis not present

## 2022-05-16 DIAGNOSIS — R7301 Impaired fasting glucose: Secondary | ICD-10-CM | POA: Diagnosis not present

## 2022-05-16 DIAGNOSIS — M85859 Other specified disorders of bone density and structure, unspecified thigh: Secondary | ICD-10-CM | POA: Diagnosis not present

## 2022-05-16 DIAGNOSIS — Z0000 Encounter for general adult medical examination without abnormal findings: Secondary | ICD-10-CM | POA: Diagnosis not present

## 2022-05-16 DIAGNOSIS — K589 Irritable bowel syndrome without diarrhea: Secondary | ICD-10-CM | POA: Diagnosis not present

## 2022-05-16 DIAGNOSIS — M810 Age-related osteoporosis without current pathological fracture: Secondary | ICD-10-CM | POA: Diagnosis not present

## 2022-05-16 DIAGNOSIS — L209 Atopic dermatitis, unspecified: Secondary | ICD-10-CM | POA: Diagnosis not present

## 2022-05-16 DIAGNOSIS — E785 Hyperlipidemia, unspecified: Secondary | ICD-10-CM | POA: Diagnosis not present

## 2022-05-16 DIAGNOSIS — G35 Multiple sclerosis: Secondary | ICD-10-CM | POA: Diagnosis not present

## 2022-05-16 DIAGNOSIS — Z23 Encounter for immunization: Secondary | ICD-10-CM | POA: Diagnosis not present

## 2022-05-22 DIAGNOSIS — Z85828 Personal history of other malignant neoplasm of skin: Secondary | ICD-10-CM | POA: Diagnosis not present

## 2022-05-22 DIAGNOSIS — D2271 Melanocytic nevi of right lower limb, including hip: Secondary | ICD-10-CM | POA: Diagnosis not present

## 2022-05-22 DIAGNOSIS — D2262 Melanocytic nevi of left upper limb, including shoulder: Secondary | ICD-10-CM | POA: Diagnosis not present

## 2022-05-22 DIAGNOSIS — D2272 Melanocytic nevi of left lower limb, including hip: Secondary | ICD-10-CM | POA: Diagnosis not present

## 2022-05-22 DIAGNOSIS — L821 Other seborrheic keratosis: Secondary | ICD-10-CM | POA: Diagnosis not present

## 2022-05-22 DIAGNOSIS — D225 Melanocytic nevi of trunk: Secondary | ICD-10-CM | POA: Diagnosis not present

## 2022-05-22 DIAGNOSIS — C44319 Basal cell carcinoma of skin of other parts of face: Secondary | ICD-10-CM | POA: Diagnosis not present

## 2022-05-22 DIAGNOSIS — D485 Neoplasm of uncertain behavior of skin: Secondary | ICD-10-CM | POA: Diagnosis not present

## 2022-05-22 DIAGNOSIS — D2261 Melanocytic nevi of right upper limb, including shoulder: Secondary | ICD-10-CM | POA: Diagnosis not present

## 2022-05-22 DIAGNOSIS — L905 Scar conditions and fibrosis of skin: Secondary | ICD-10-CM | POA: Diagnosis not present

## 2022-05-22 DIAGNOSIS — D2372 Other benign neoplasm of skin of left lower limb, including hip: Secondary | ICD-10-CM | POA: Diagnosis not present

## 2022-06-19 DIAGNOSIS — Z85828 Personal history of other malignant neoplasm of skin: Secondary | ICD-10-CM | POA: Diagnosis not present

## 2022-06-19 DIAGNOSIS — C44319 Basal cell carcinoma of skin of other parts of face: Secondary | ICD-10-CM | POA: Diagnosis not present

## 2022-10-22 ENCOUNTER — Other Ambulatory Visit: Payer: Self-pay | Admitting: Internal Medicine

## 2022-10-22 DIAGNOSIS — Z1231 Encounter for screening mammogram for malignant neoplasm of breast: Secondary | ICD-10-CM

## 2022-11-16 ENCOUNTER — Ambulatory Visit (HOSPITAL_COMMUNITY)
Admission: EM | Admit: 2022-11-16 | Discharge: 2022-11-16 | Disposition: A | Discharge status: Home / Self Care | Payer: Medicare Other

## 2022-11-16 ENCOUNTER — Encounter (HOSPITAL_COMMUNITY): Payer: Self-pay

## 2022-11-16 DIAGNOSIS — R21 Rash and other nonspecific skin eruption: Secondary | ICD-10-CM

## 2022-11-16 MED ORDER — TRIAMCINOLONE ACETONIDE 0.1 % EX OINT
1.0000 | TOPICAL_OINTMENT | Freq: Two times a day (BID) | CUTANEOUS | 0 refills | Status: AC
Start: 2022-11-16 — End: ?

## 2022-11-16 MED ORDER — PREDNISONE 10 MG (21) PO TBPK: ORAL_TABLET | ORAL | 0 refills | Status: AC

## 2022-11-16 NOTE — Discharge Instructions (Signed)
Please start taking the oral prednisone and use the Kenalog ointment for the rash.  Follow up with your Dermatologist as planned.

## 2022-11-16 NOTE — ED Provider Notes (Signed)
[facility]    CSN: [id number] Arrival date & time: 11/16/22  1224      History   Chief Complaint Chief Complaint  Patient presents with   Rash    X2 days    HPI Audrey Nelson is a 77 y.o. female.   Patient presents today for 2 day history of rash on both legs.  Reports she noticed it after coming in from being outside and felt very itchy all over.  Thought initially may have been chigger bites and noticed a fluid filled blister on the top of her left foot.  It has since "busted" and now there are blisters on both legs.  No recent change in detergents, soaps, or personal care products.  Reports rash is no longer itchy.  No fevers or nausea/vomiting.      History reviewed. No pertinent past medical history.  There are no problems to display for this patient.   Past Surgical History:  Procedure Laterality Date   BREAST EXCISIONAL BIOPSY Left     OB History   No obstetric history on file.      Home Medications    Prior to Admission medications   Medication Sig Start Date End Date Taking? Authorizing Provider  Acetaminophen (TYLENOL 8 HOUR PO) Take by mouth.   Yes [provider]  CALCIUM PO Take 1 tablet by mouth daily.   Yes [provider]  Cholecalciferol (VITAMIN D-3 PO) Take by mouth.   Yes [provider]  Multiple Vitamin (MULTIVITAMIN) tablet Take 1 tablet by mouth daily.   Yes [provider]  predniSONE (STERAPRED UNI-PAK 21 TAB) 10 MG (21) TBPK tablet Take 6 tablets (60 mg total) by mouth daily for 1 day, THEN 5 tablets (50 mg total) daily for 1 day, THEN 4 tablets (40 mg total) daily for 1 day, THEN 3 tablets (30 mg total) daily for 1 day, THEN 2 tablets (20 mg total) daily for 1 day, THEN 1 tablet (10 mg total) daily for 1 day. 11/16/22 11/22/22 Yes Cathlean Marseilles A, NP  triamcinolone ointment (KENALOG) 0.1 % Apply 1 Application topically 2 (two) times daily. Apply sparingly twice daily as needed 11/16/22  Yes  Cathlean Marseilles A, NP  aspirin 81 MG tablet Take 81 mg by mouth once.    [provider]    Family History History reviewed. No pertinent family history.  Social History Social History   Tobacco Use   Smoking status: Never   Smokeless tobacco: Never  Vaping Use   Vaping status: Never Used  Substance Use Topics   Alcohol use: Never   Drug use: Never     Allergies   Patient has no known allergies.   Review of Systems Review of Systems Per HPI  Physical Exam Triage Vital Signs ED Triage Vitals  Encounter Vitals Group     BP 11/16/22 1304 137/82     Systolic BP Percentile --      Diastolic BP Percentile --      Pulse Rate 11/16/22 1304 83     Resp 11/16/22 1304 17     Temp 11/16/22 1304 98.7 F (37.1 C)     Temp Source 11/16/22 1304 Oral     SpO2 11/16/22 1304 96 %     Weight 11/16/22 1301 140 lb (63.5 kg)     Height 11/16/22 1301 5\' 7"  (1.702 m)     Head Circumference --      Peak Flow --  Pain Score 11/16/22 1300 1     Pain Loc --      Pain Education --      Exclude from Growth Chart --    No data found.  Updated Vital Signs BP 137/82 (BP Location: Right Arm)   Pulse 83   Temp 98.7 F (37.1 C) (Oral)   Resp 17   Ht 5\' 7"  (1.702 m)   Wt 140 lb (63.5 kg)   SpO2 96%   BMI 21.93 kg/m   Visual Acuity Right Eye Distance:   Left Eye Distance:   Bilateral Distance:    Right Eye Near:   Left Eye Near:    Bilateral Near:     Physical Exam Vitals and nursing note reviewed.  Constitutional:      General: She is not in acute distress.    Appearance: Normal appearance. She is not toxic-appearing.  HENT:     Mouth/Throat:     Mouth: Mucous membranes are moist.     Pharynx: Oropharynx is clear.  Pulmonary:     Effort: Pulmonary effort is normal. No respiratory distress.  Skin:    General: Skin is warm and dry.     Capillary Refill: Capillary refill takes less than 2 seconds.     Findings: Rash present. Rash is vesicular.      Comments: Vesicular rash noted to bilateral lower extremities.  Large erythematous area to LLE that is not blanchable. See photograph.  Neurological:     Mental Status: She is alert and oriented to person, place, and time.  Psychiatric:        Behavior: Behavior is cooperative.      UC Treatments / Results  Labs (all labs ordered are listed, but only abnormal results are displayed) Labs Reviewed - No data to display  EKG   Radiology No results found.  Procedures Procedures (including critical care time)  Medications Ordered in UC Medications - No data to display  Initial Impression / Assessment and Plan / UC Course  I have reviewed the triage vital signs and the nursing notes.  Pertinent labs & imaging results that were available during my care of the patient were reviewed by me and considered in my medical decision making (see chart for details).   Patient is well-appearing, normotensive, afebrile, not tachycardic, not tachypneic, oxygenating well on room air.    1. Rash Unclear cause Possible contact dermatitis with secondary vasculitis  Treat with topical steroid ointment, oral prednisone taper Patient has follow up scheduled with Dermatology in 72 hours; encouraged to keep appointment for further evaluation/management if not improved   The patient was given the opportunity to ask questions.  All questions answered to their satisfaction.  The patient is in agreement to this plan.    Final Clinical Impressions(s) / UC Diagnoses   Final diagnoses:  Rash     Discharge Instructions      Please start taking the oral prednisone and use the Kenalog ointment for the rash.  Follow up with your Dermatologist as planned.    ED Prescriptions     Medication Sig Dispense Auth. Provider   triamcinolone ointment (KENALOG) 0.1 % Apply 1 Application topically 2 (two) times daily. Apply sparingly twice daily as needed 30 g Cathlean Marseilles A, NP   predniSONE (STERAPRED  UNI-PAK 21 TAB) 10 MG (21) TBPK tablet Take 6 tablets (60 mg total) by mouth daily for 1 day, THEN 5 tablets (50 mg total) daily for 1 day, THEN 4 tablets (40  mg total) daily for 1 day, THEN 3 tablets (30 mg total) daily for 1 day, THEN 2 tablets (20 mg total) daily for 1 day, THEN 1 tablet (10 mg total) daily for 1 day. 21 tablet Valentino Nose, NP      PDMP not reviewed this encounter.   Valentino Nose, NP 11/16/22 [phone]

## 2022-11-16 NOTE — ED Triage Notes (Signed)
Pt states that she has a rash on both legs. X2 days

## 2022-11-19 DIAGNOSIS — Z85828 Personal history of other malignant neoplasm of skin: Secondary | ICD-10-CM | POA: Diagnosis not present

## 2022-11-19 DIAGNOSIS — M310 Hypersensitivity angiitis: Secondary | ICD-10-CM | POA: Diagnosis not present

## 2022-12-12 DIAGNOSIS — Z85828 Personal history of other malignant neoplasm of skin: Secondary | ICD-10-CM | POA: Diagnosis not present

## 2022-12-12 DIAGNOSIS — M310 Hypersensitivity angiitis: Secondary | ICD-10-CM | POA: Diagnosis not present

## 2022-12-18 DIAGNOSIS — Z23 Encounter for immunization: Secondary | ICD-10-CM | POA: Diagnosis not present

## 2023-01-27 ENCOUNTER — Ambulatory Visit
Admission: RE | Admit: 2023-01-27 | Discharge: 2023-01-27 | Disposition: A | Discharge status: Home / Self Care | Payer: Medicare Other | Source: Ambulatory Visit | Attending: Internal Medicine | Admitting: Internal Medicine

## 2023-01-27 DIAGNOSIS — Z1231 Encounter for screening mammogram for malignant neoplasm of breast: Secondary | ICD-10-CM

## 2023-05-28 DIAGNOSIS — D2262 Melanocytic nevi of left upper limb, including shoulder: Secondary | ICD-10-CM | POA: Diagnosis not present

## 2023-05-28 DIAGNOSIS — D485 Neoplasm of uncertain behavior of skin: Secondary | ICD-10-CM | POA: Diagnosis not present

## 2023-05-28 DIAGNOSIS — D2271 Melanocytic nevi of right lower limb, including hip: Secondary | ICD-10-CM | POA: Diagnosis not present

## 2023-05-28 DIAGNOSIS — Z85828 Personal history of other malignant neoplasm of skin: Secondary | ICD-10-CM | POA: Diagnosis not present

## 2023-05-28 DIAGNOSIS — D225 Melanocytic nevi of trunk: Secondary | ICD-10-CM | POA: Diagnosis not present

## 2023-05-28 DIAGNOSIS — L905 Scar conditions and fibrosis of skin: Secondary | ICD-10-CM | POA: Diagnosis not present

## 2023-05-28 DIAGNOSIS — L718 Other rosacea: Secondary | ICD-10-CM | POA: Diagnosis not present

## 2023-05-28 DIAGNOSIS — L821 Other seborrheic keratosis: Secondary | ICD-10-CM | POA: Diagnosis not present

## 2023-05-28 DIAGNOSIS — D2261 Melanocytic nevi of right upper limb, including shoulder: Secondary | ICD-10-CM | POA: Diagnosis not present

## 2023-05-28 DIAGNOSIS — D2272 Melanocytic nevi of left lower limb, including hip: Secondary | ICD-10-CM | POA: Diagnosis not present

## 2023-05-28 DIAGNOSIS — L820 Inflamed seborrheic keratosis: Secondary | ICD-10-CM | POA: Diagnosis not present

## 2023-06-26 DIAGNOSIS — Z1331 Encounter for screening for depression: Secondary | ICD-10-CM | POA: Diagnosis not present

## 2023-06-26 DIAGNOSIS — M810 Age-related osteoporosis without current pathological fracture: Secondary | ICD-10-CM | POA: Diagnosis not present

## 2023-06-26 DIAGNOSIS — Z0000 Encounter for general adult medical examination without abnormal findings: Secondary | ICD-10-CM | POA: Diagnosis not present

## 2023-06-26 DIAGNOSIS — K589 Irritable bowel syndrome without diarrhea: Secondary | ICD-10-CM | POA: Diagnosis not present

## 2023-06-26 DIAGNOSIS — R252 Cramp and spasm: Secondary | ICD-10-CM | POA: Diagnosis not present

## 2023-06-26 DIAGNOSIS — Z79899 Other long term (current) drug therapy: Secondary | ICD-10-CM | POA: Diagnosis not present

## 2023-06-26 DIAGNOSIS — C4491 Basal cell carcinoma of skin, unspecified: Secondary | ICD-10-CM | POA: Diagnosis not present

## 2023-06-26 DIAGNOSIS — R7303 Prediabetes: Secondary | ICD-10-CM | POA: Diagnosis not present

## 2023-06-26 DIAGNOSIS — E785 Hyperlipidemia, unspecified: Secondary | ICD-10-CM | POA: Diagnosis not present

## 2023-11-11 ENCOUNTER — Other Ambulatory Visit: Payer: Self-pay | Admitting: Internal Medicine

## 2023-11-11 DIAGNOSIS — Z0000 Encounter for general adult medical examination without abnormal findings: Secondary | ICD-10-CM

## 2023-11-15 DIAGNOSIS — Z23 Encounter for immunization: Secondary | ICD-10-CM | POA: Diagnosis not present

## 2023-11-18 DIAGNOSIS — D225 Melanocytic nevi of trunk: Secondary | ICD-10-CM | POA: Diagnosis not present

## 2023-11-18 DIAGNOSIS — D2239 Melanocytic nevi of other parts of face: Secondary | ICD-10-CM | POA: Diagnosis not present

## 2023-11-18 DIAGNOSIS — L821 Other seborrheic keratosis: Secondary | ICD-10-CM | POA: Diagnosis not present

## 2023-11-18 DIAGNOSIS — D485 Neoplasm of uncertain behavior of skin: Secondary | ICD-10-CM | POA: Diagnosis not present

## 2023-11-18 DIAGNOSIS — Z85828 Personal history of other malignant neoplasm of skin: Secondary | ICD-10-CM | POA: Diagnosis not present

## 2023-11-18 DIAGNOSIS — L814 Other melanin hyperpigmentation: Secondary | ICD-10-CM | POA: Diagnosis not present

## 2023-11-20 ENCOUNTER — Ambulatory Visit: Admitting: Podiatry

## 2024-01-28 ENCOUNTER — Inpatient Hospital Stay: Admission: RE | Admit: 2024-01-28 | Discharge: 2024-01-28 | Attending: Internal Medicine | Admitting: Internal Medicine

## 2024-01-28 DIAGNOSIS — Z0000 Encounter for general adult medical examination without abnormal findings: Secondary | ICD-10-CM
